# Patient Record
Sex: Female | Born: 1935 | Race: Black or African American | Hispanic: No | Smoking: Never smoker
Health system: Southern US, Community
[De-identification: ages and names within clinical notes are randomized; demographics above are authoritative.]

## PROBLEM LIST (undated history)

## (undated) DIAGNOSIS — I10 Essential (primary) hypertension: Secondary | ICD-10-CM

## (undated) DIAGNOSIS — F0390 Unspecified dementia without behavioral disturbance: Secondary | ICD-10-CM

## (undated) DIAGNOSIS — E7800 Pure hypercholesterolemia, unspecified: Secondary | ICD-10-CM

## (undated) DIAGNOSIS — A419 Sepsis, unspecified organism: Secondary | ICD-10-CM

## (undated) DIAGNOSIS — I5032 Chronic diastolic (congestive) heart failure: Secondary | ICD-10-CM

## (undated) DIAGNOSIS — N183 Chronic kidney disease, stage 3 unspecified: Secondary | ICD-10-CM

## (undated) DIAGNOSIS — K625 Hemorrhage of anus and rectum: Secondary | ICD-10-CM

## (undated) DIAGNOSIS — R55 Syncope and collapse: Secondary | ICD-10-CM

## (undated) DIAGNOSIS — K529 Noninfective gastroenteritis and colitis, unspecified: Secondary | ICD-10-CM

## (undated) DIAGNOSIS — E119 Type 2 diabetes mellitus without complications: Secondary | ICD-10-CM

## (undated) DIAGNOSIS — T7840XA Allergy, unspecified, initial encounter: Secondary | ICD-10-CM

## (undated) DIAGNOSIS — R569 Unspecified convulsions: Secondary | ICD-10-CM

## (undated) DIAGNOSIS — K219 Gastro-esophageal reflux disease without esophagitis: Secondary | ICD-10-CM

## (undated) DIAGNOSIS — D649 Anemia, unspecified: Secondary | ICD-10-CM

## (undated) DIAGNOSIS — I2699 Other pulmonary embolism without acute cor pulmonale: Secondary | ICD-10-CM

## (undated) DIAGNOSIS — F32A Depression, unspecified: Secondary | ICD-10-CM

## (undated) DIAGNOSIS — F329 Major depressive disorder, single episode, unspecified: Secondary | ICD-10-CM

## (undated) DIAGNOSIS — E785 Hyperlipidemia, unspecified: Secondary | ICD-10-CM

## (undated) DIAGNOSIS — M1990 Unspecified osteoarthritis, unspecified site: Secondary | ICD-10-CM

## (undated) HISTORY — DX: Unspecified convulsions: R56.9

## (undated) HISTORY — DX: Syncope and collapse: R55

## (undated) HISTORY — DX: Type 2 diabetes mellitus without complications: E11.9

## (undated) HISTORY — DX: Anemia, unspecified: D64.9

## (undated) HISTORY — DX: Allergy, unspecified, initial encounter: T78.40XA

## (undated) HISTORY — DX: Unspecified osteoarthritis, unspecified site: M19.90

## (undated) HISTORY — DX: Essential (primary) hypertension: I10

---

## 1998-12-31 ENCOUNTER — Ambulatory Visit (HOSPITAL_COMMUNITY): Admission: RE | Admit: 1998-12-31 | Discharge: 1998-12-31 | Payer: Self-pay | Admitting: Internal Medicine

## 2002-01-16 ENCOUNTER — Encounter: Payer: Self-pay | Admitting: Orthopedic Surgery

## 2002-01-16 ENCOUNTER — Emergency Department (HOSPITAL_COMMUNITY): Admission: EM | Admit: 2002-01-16 | Discharge: 2002-01-16 | Payer: Self-pay | Admitting: Emergency Medicine

## 2002-01-16 ENCOUNTER — Encounter: Payer: Self-pay | Admitting: Emergency Medicine

## 2003-12-16 ENCOUNTER — Inpatient Hospital Stay (HOSPITAL_COMMUNITY): Admission: EM | Admit: 2003-12-16 | Discharge: 2003-12-17 | Payer: Self-pay | Admitting: Emergency Medicine

## 2004-06-15 ENCOUNTER — Encounter: Admission: RE | Admit: 2004-06-15 | Discharge: 2004-06-15 | Payer: Self-pay | Admitting: Internal Medicine

## 2004-08-31 ENCOUNTER — Encounter: Admission: RE | Admit: 2004-08-31 | Discharge: 2004-08-31 | Payer: Self-pay | Admitting: Internal Medicine

## 2004-09-04 ENCOUNTER — Emergency Department (HOSPITAL_COMMUNITY): Admission: EM | Admit: 2004-09-04 | Discharge: 2004-09-04 | Payer: Self-pay | Admitting: Emergency Medicine

## 2006-05-11 ENCOUNTER — Emergency Department (HOSPITAL_COMMUNITY): Admission: EM | Admit: 2006-05-11 | Discharge: 2006-05-11 | Payer: Self-pay | Admitting: Emergency Medicine

## 2007-03-18 ENCOUNTER — Emergency Department (HOSPITAL_COMMUNITY): Admission: EM | Admit: 2007-03-18 | Discharge: 2007-03-18 | Payer: Self-pay | Admitting: Emergency Medicine

## 2007-10-21 ENCOUNTER — Encounter: Admission: RE | Admit: 2007-10-21 | Discharge: 2007-10-21 | Payer: Self-pay | Admitting: Internal Medicine

## 2008-04-24 ENCOUNTER — Inpatient Hospital Stay (HOSPITAL_COMMUNITY): Admission: EM | Admit: 2008-04-24 | Discharge: 2008-04-30 | Payer: Self-pay | Admitting: Emergency Medicine

## 2008-05-12 ENCOUNTER — Inpatient Hospital Stay (HOSPITAL_COMMUNITY): Admission: EM | Admit: 2008-05-12 | Discharge: 2008-05-14 | Payer: Self-pay | Admitting: Emergency Medicine

## 2008-09-13 ENCOUNTER — Emergency Department (HOSPITAL_COMMUNITY): Admission: EM | Admit: 2008-09-13 | Discharge: 2008-09-14 | Payer: Self-pay | Admitting: Emergency Medicine

## 2009-03-12 ENCOUNTER — Inpatient Hospital Stay (HOSPITAL_COMMUNITY): Admission: RE | Admit: 2009-03-12 | Discharge: 2009-03-15 | Payer: Self-pay | Admitting: Orthopaedic Surgery

## 2010-07-08 ENCOUNTER — Inpatient Hospital Stay (HOSPITAL_COMMUNITY): Admission: RE | Admit: 2010-07-08 | Discharge: 2010-07-12 | Payer: Self-pay | Admitting: Orthopaedic Surgery

## 2010-09-26 ENCOUNTER — Inpatient Hospital Stay (HOSPITAL_COMMUNITY): Admission: EM | Admit: 2010-09-26 | Discharge: 2010-09-28 | Payer: Self-pay | Admitting: Emergency Medicine

## 2010-09-27 ENCOUNTER — Encounter (INDEPENDENT_AMBULATORY_CARE_PROVIDER_SITE_OTHER): Payer: Self-pay | Admitting: Internal Medicine

## 2010-11-17 ENCOUNTER — Inpatient Hospital Stay (HOSPITAL_COMMUNITY)
Admission: EM | Admit: 2010-11-17 | Discharge: 2010-11-25 | Disposition: A | Discharge status: Other Acute Inpatient Hospital | Payer: Self-pay | Source: Home / Self Care

## 2010-11-18 ENCOUNTER — Encounter (INDEPENDENT_AMBULATORY_CARE_PROVIDER_SITE_OTHER): Payer: Self-pay

## 2010-11-20 HISTORY — PX: OTHER SURGICAL HISTORY: SHX169

## 2010-11-23 LAB — GLUCOSE, CAPILLARY
Glucose-Capillary: 123 mg/dL — ABNORMAL HIGH (ref 70–99)
Glucose-Capillary: 129 mg/dL — ABNORMAL HIGH (ref 70–99)
Glucose-Capillary: 129 mg/dL — ABNORMAL HIGH (ref 70–99)
Glucose-Capillary: 131 mg/dL — ABNORMAL HIGH (ref 70–99)
Glucose-Capillary: 133 mg/dL — ABNORMAL HIGH (ref 70–99)
Glucose-Capillary: 147 mg/dL — ABNORMAL HIGH (ref 70–99)

## 2010-11-24 LAB — CBC
HCT: 25.3 % — ABNORMAL LOW (ref 36.0–46.0)
Hemoglobin: 8.1 g/dL — ABNORMAL LOW (ref 12.0–15.0)
MCH: 26.9 pg (ref 26.0–34.0)
MCHC: 32 g/dL (ref 30.0–36.0)
MCV: 84.1 fL (ref 78.0–100.0)
Platelets: 413 10*3/uL — ABNORMAL HIGH (ref 150–400)
RBC: 3.01 MIL/uL — ABNORMAL LOW (ref 3.87–5.11)
RDW: 16.3 % — ABNORMAL HIGH (ref 11.5–15.5)
WBC: 7.5 10*3/uL (ref 4.0–10.5)

## 2010-11-24 LAB — COMPREHENSIVE METABOLIC PANEL
ALT: 21 U/L (ref 0–35)
AST: 25 U/L (ref 0–37)
Albumin: 2.2 g/dL — ABNORMAL LOW (ref 3.5–5.2)
Alkaline Phosphatase: 84 U/L (ref 39–117)
BUN: 6 mg/dL (ref 6–23)
CO2: 20 mEq/L (ref 19–32)
Calcium: 8.5 mg/dL (ref 8.4–10.5)
Chloride: 113 mEq/L — ABNORMAL HIGH (ref 96–112)
Creatinine, Ser: 1.25 mg/dL — ABNORMAL HIGH (ref 0.4–1.2)
GFR calc Af Amer: 51 mL/min — ABNORMAL LOW (ref >60)
GFR calc non Af Amer: 42 mL/min — ABNORMAL LOW (ref >60)
Glucose, Bld: 120 mg/dL — ABNORMAL HIGH (ref 70–99)
Potassium: 4.2 mEq/L (ref 3.5–5.1)
Sodium: 140 mEq/L (ref 135–145)
Total Bilirubin: 0.7 mg/dL (ref 0.3–1.2)
Total Protein: 6.4 g/dL (ref 6.0–8.3)

## 2010-11-24 LAB — GLUCOSE, CAPILLARY
Glucose-Capillary: 135 mg/dL — ABNORMAL HIGH (ref 70–99)
Glucose-Capillary: 108 mg/dL — ABNORMAL HIGH (ref 70–99)
Glucose-Capillary: 129 mg/dL — ABNORMAL HIGH (ref 70–99)
Glucose-Capillary: 140 mg/dL — ABNORMAL HIGH (ref 70–99)
Glucose-Capillary: 121 mg/dL — ABNORMAL HIGH (ref 70–99)
Glucose-Capillary: 89 mg/dL (ref 70–99)

## 2010-11-24 LAB — PROTIME-INR
INR: 1.7 — ABNORMAL HIGH (ref 0.00–1.49)
Prothrombin Time: 20.2 seconds — ABNORMAL HIGH (ref 11.6–15.2)

## 2010-11-24 LAB — PHOSPHORUS: Phosphorus: 2.6 mg/dL (ref 2.3–4.6)

## 2010-11-24 LAB — TSH: TSH: 2.718 u[IU]/mL (ref 0.350–4.500)

## 2010-11-24 LAB — MAGNESIUM: Magnesium: 2.3 mg/dL (ref 1.5–2.5)

## 2010-11-25 LAB — GLUCOSE, CAPILLARY
Glucose-Capillary: 100 mg/dL — ABNORMAL HIGH (ref 70–99)
Glucose-Capillary: 106 mg/dL — ABNORMAL HIGH (ref 70–99)
Glucose-Capillary: 108 mg/dL — ABNORMAL HIGH (ref 70–99)
Glucose-Capillary: 116 mg/dL — ABNORMAL HIGH (ref 70–99)

## 2010-12-05 LAB — GLUCOSE, CAPILLARY: Glucose-Capillary: 97 mg/dL (ref 70–99)

## 2010-12-11 ENCOUNTER — Encounter: Payer: Self-pay | Admitting: Internal Medicine

## 2011-01-14 ENCOUNTER — Emergency Department (HOSPITAL_COMMUNITY): Payer: Medicare Other

## 2011-01-14 ENCOUNTER — Inpatient Hospital Stay (HOSPITAL_COMMUNITY)
Admission: EM | Admit: 2011-01-14 | Discharge: 2011-01-20 | DRG: 683 | Disposition: A | Discharge status: Skilled Nursing Facility | Payer: Medicare Other | Attending: Internal Medicine | Admitting: Internal Medicine

## 2011-01-14 DIAGNOSIS — Z7901 Long term (current) use of anticoagulants: Secondary | ICD-10-CM

## 2011-01-14 DIAGNOSIS — K929 Disease of digestive system, unspecified: Secondary | ICD-10-CM | POA: Diagnosis present

## 2011-01-14 DIAGNOSIS — E7800 Pure hypercholesterolemia, unspecified: Secondary | ICD-10-CM | POA: Diagnosis present

## 2011-01-14 DIAGNOSIS — E119 Type 2 diabetes mellitus without complications: Secondary | ICD-10-CM | POA: Diagnosis present

## 2011-01-14 DIAGNOSIS — E860 Dehydration: Secondary | ICD-10-CM | POA: Diagnosis present

## 2011-01-14 DIAGNOSIS — E876 Hypokalemia: Secondary | ICD-10-CM | POA: Diagnosis present

## 2011-01-14 DIAGNOSIS — I129 Hypertensive chronic kidney disease with stage 1 through stage 4 chronic kidney disease, or unspecified chronic kidney disease: Secondary | ICD-10-CM | POA: Diagnosis present

## 2011-01-14 DIAGNOSIS — N3941 Urge incontinence: Secondary | ICD-10-CM | POA: Diagnosis present

## 2011-01-14 DIAGNOSIS — N179 Acute kidney failure, unspecified: Principal | ICD-10-CM | POA: Diagnosis present

## 2011-01-14 DIAGNOSIS — I824Y9 Acute embolism and thrombosis of unspecified deep veins of unspecified proximal lower extremity: Secondary | ICD-10-CM | POA: Diagnosis not present

## 2011-01-14 DIAGNOSIS — D509 Iron deficiency anemia, unspecified: Secondary | ICD-10-CM | POA: Diagnosis present

## 2011-01-14 DIAGNOSIS — Y832 Surgical operation with anastomosis, bypass or graft as the cause of abnormal reaction of the patient, or of later complication, without mention of misadventure at the time of the procedure: Secondary | ICD-10-CM | POA: Diagnosis present

## 2011-01-14 DIAGNOSIS — R569 Unspecified convulsions: Secondary | ICD-10-CM | POA: Diagnosis present

## 2011-01-14 DIAGNOSIS — Z86711 Personal history of pulmonary embolism: Secondary | ICD-10-CM

## 2011-01-14 DIAGNOSIS — N189 Chronic kidney disease, unspecified: Secondary | ICD-10-CM | POA: Diagnosis present

## 2011-01-14 DIAGNOSIS — Z79899 Other long term (current) drug therapy: Secondary | ICD-10-CM

## 2011-01-14 DIAGNOSIS — F0390 Unspecified dementia without behavioral disturbance: Secondary | ICD-10-CM | POA: Diagnosis present

## 2011-01-14 DIAGNOSIS — R791 Abnormal coagulation profile: Secondary | ICD-10-CM | POA: Diagnosis present

## 2011-01-14 LAB — PROTIME-INR
INR: 1.1 (ref 0.00–1.49)
Prothrombin Time: 14.4 seconds (ref 11.6–15.2)

## 2011-01-14 LAB — TROPONIN I: Troponin I: 0.04 ng/mL (ref 0.00–0.06)

## 2011-01-14 LAB — POCT CARDIAC MARKERS: Troponin i, poc: <0.05 ng/mL (ref 0.00–0.09)

## 2011-01-14 LAB — TSH: TSH: 0.674 u[IU]/mL (ref 0.350–4.500)

## 2011-01-14 LAB — DIFFERENTIAL
Eosinophils Absolute: 0.1 10*3/uL (ref 0.0–0.7)
Lymphs Abs: 1.5 10*3/uL (ref 0.7–4.0)
Monocytes Relative: 9 % (ref 3–12)
Neutro Abs: 6.6 10*3/uL (ref 1.7–7.7)
Neutrophils Relative %: 74 % (ref 43–77)

## 2011-01-14 LAB — CBC
HCT: 30.1 % — ABNORMAL LOW (ref 36.0–46.0)
Hemoglobin: 9.5 g/dL — ABNORMAL LOW (ref 12.0–15.0)
MCH: 26.2 pg (ref 26.0–34.0)
MCV: 82.9 fL (ref 78.0–100.0)
RBC: 3.63 MIL/uL — ABNORMAL LOW (ref 3.87–5.11)

## 2011-01-14 LAB — GLUCOSE, CAPILLARY: Glucose-Capillary: 125 mg/dL — ABNORMAL HIGH (ref 70–99)

## 2011-01-14 LAB — BASIC METABOLIC PANEL
Chloride: 107 mEq/L (ref 96–112)
Creatinine, Ser: 3.73 mg/dL — ABNORMAL HIGH (ref 0.4–1.2)
GFR calc Af Amer: 14 mL/min — ABNORMAL LOW (ref >60)
Potassium: 3.3 mEq/L — ABNORMAL LOW (ref 3.5–5.1)
Sodium: 138 mEq/L (ref 135–145)

## 2011-01-14 LAB — MAGNESIUM: Magnesium: 1.7 mg/dL (ref 1.5–2.5)

## 2011-01-14 LAB — CK TOTAL AND CKMB (NOT AT ARMC): CK, MB: 1.8 ng/mL (ref 0.3–4.0)

## 2011-01-15 LAB — BASIC METABOLIC PANEL
BUN: 45 mg/dL — ABNORMAL HIGH (ref 6–23)
CO2: 19 mEq/L (ref 19–32)
Chloride: 111 mEq/L (ref 96–112)
Glucose, Bld: 97 mg/dL (ref 70–99)
Potassium: 2.9 mEq/L — ABNORMAL LOW (ref 3.5–5.1)

## 2011-01-15 LAB — CK TOTAL AND CKMB (NOT AT ARMC)
CK, MB: 1.5 ng/mL (ref 0.3–4.0)
Relative Index: INVALID (ref 0.0–2.5)
Total CK: 63 U/L (ref 7–177)
Total CK: 62 U/L (ref 7–177)

## 2011-01-15 LAB — GLUCOSE, CAPILLARY
Glucose-Capillary: 94 mg/dL (ref 70–99)
Glucose-Capillary: 111 mg/dL — ABNORMAL HIGH (ref 70–99)

## 2011-01-15 LAB — LIPID PANEL
HDL: 38 mg/dL — ABNORMAL LOW (ref >39)
Total CHOL/HDL Ratio: 4.4 RATIO

## 2011-01-15 LAB — VITAMIN B12: Vitamin B-12: 405 pg/mL (ref 211–911)

## 2011-01-16 ENCOUNTER — Inpatient Hospital Stay (HOSPITAL_COMMUNITY): Payer: Medicare Other

## 2011-01-16 LAB — COMPREHENSIVE METABOLIC PANEL
ALT: 12 U/L (ref 0–35)
AST: 14 U/L (ref 0–37)
Alkaline Phosphatase: 64 U/L (ref 39–117)
Calcium: 8.6 mg/dL (ref 8.4–10.5)
GFR calc Af Amer: 20 mL/min — ABNORMAL LOW (ref >60)
Potassium: 3.4 mEq/L — ABNORMAL LOW (ref 3.5–5.1)
Sodium: 142 mEq/L (ref 135–145)
Total Protein: 7 g/dL (ref 6.0–8.3)

## 2011-01-16 LAB — GLUCOSE, CAPILLARY
Glucose-Capillary: 92 mg/dL (ref 70–99)
Glucose-Capillary: 135 mg/dL — ABNORMAL HIGH (ref 70–99)

## 2011-01-16 LAB — PROTIME-INR: Prothrombin Time: 14.4 seconds (ref 11.6–15.2)

## 2011-01-16 LAB — CBC
Hemoglobin: 7.7 g/dL — ABNORMAL LOW (ref 12.0–15.0)
MCV: 83.2 fL (ref 78.0–100.0)
Platelets: 175 10*3/uL (ref 150–400)
RBC: 2.91 MIL/uL — ABNORMAL LOW (ref 3.87–5.11)
WBC: 8.4 10*3/uL (ref 4.0–10.5)

## 2011-01-17 DIAGNOSIS — R55 Syncope and collapse: Secondary | ICD-10-CM

## 2011-01-17 LAB — COMPREHENSIVE METABOLIC PANEL
ALT: 11 U/L (ref 0–35)
Albumin: 2.3 g/dL — ABNORMAL LOW (ref 3.5–5.2)
Calcium: 8.2 mg/dL — ABNORMAL LOW (ref 8.4–10.5)
GFR calc Af Amer: 25 mL/min — ABNORMAL LOW (ref >60)
Glucose, Bld: 85 mg/dL (ref 70–99)
Sodium: 145 mEq/L (ref 135–145)
Total Protein: 6.7 g/dL (ref 6.0–8.3)

## 2011-01-17 LAB — PROTIME-INR
INR: 1.27 (ref 0.00–1.49)
Prothrombin Time: 16.1 seconds — ABNORMAL HIGH (ref 11.6–15.2)

## 2011-01-17 LAB — CBC
MCH: 26.7 pg (ref 26.0–34.0)
Platelets: 194 10*3/uL (ref 150–400)
RBC: 2.77 MIL/uL — ABNORMAL LOW (ref 3.87–5.11)
WBC: 8.6 10*3/uL (ref 4.0–10.5)

## 2011-01-17 LAB — VITAMIN B12: Vitamin B-12: 282 pg/mL (ref 211–911)

## 2011-01-17 LAB — FOLATE: Folate: 2.9 ng/mL — ABNORMAL LOW

## 2011-01-17 LAB — GLUCOSE, CAPILLARY
Glucose-Capillary: 106 mg/dL — ABNORMAL HIGH (ref 70–99)
Glucose-Capillary: 89 mg/dL (ref 70–99)
Glucose-Capillary: 105 mg/dL — ABNORMAL HIGH (ref 70–99)

## 2011-01-17 LAB — IRON AND TIBC: UIBC: 167 ug/dL

## 2011-01-18 DIAGNOSIS — M7989 Other specified soft tissue disorders: Secondary | ICD-10-CM

## 2011-01-18 LAB — IRON AND TIBC
Iron: 22 ug/dL — ABNORMAL LOW (ref 42–135)
Saturation Ratios: 12 % — ABNORMAL LOW (ref 20–55)
TIBC: 188 ug/dL — ABNORMAL LOW (ref 250–470)

## 2011-01-18 LAB — GLUCOSE, CAPILLARY
Glucose-Capillary: 89 mg/dL (ref 70–99)
Glucose-Capillary: 96 mg/dL (ref 70–99)

## 2011-01-18 LAB — CBC
MCV: 83.5 fL (ref 78.0–100.0)
Platelets: 226 10*3/uL (ref 150–400)
RBC: 2.91 MIL/uL — ABNORMAL LOW (ref 3.87–5.11)
WBC: 8.4 10*3/uL (ref 4.0–10.5)

## 2011-01-18 LAB — VITAMIN B12: Vitamin B-12: 266 pg/mL (ref 211–911)

## 2011-01-18 LAB — BASIC METABOLIC PANEL
Chloride: 118 mEq/L — ABNORMAL HIGH (ref 96–112)
GFR calc Af Amer: 29 mL/min — ABNORMAL LOW (ref >60)
Potassium: 3.4 mEq/L — ABNORMAL LOW (ref 3.5–5.1)

## 2011-01-18 LAB — PROTIME-INR
INR: 1.44 (ref 0.00–1.49)
Prothrombin Time: 17.7 seconds — ABNORMAL HIGH (ref 11.6–15.2)

## 2011-01-19 ENCOUNTER — Inpatient Hospital Stay (HOSPITAL_COMMUNITY): Payer: Medicare Other

## 2011-01-19 LAB — BASIC METABOLIC PANEL
CO2: 20 mEq/L (ref 19–32)
Calcium: 8.8 mg/dL (ref 8.4–10.5)
Chloride: 112 mEq/L (ref 96–112)
GFR calc Af Amer: 30 mL/min — ABNORMAL LOW (ref >60)
Glucose, Bld: 95 mg/dL (ref 70–99)
Sodium: 143 mEq/L (ref 135–145)

## 2011-01-19 LAB — CROSSMATCH
ABO/RH(D): A POS
Antibody Screen: NEGATIVE

## 2011-01-19 LAB — GLUCOSE, CAPILLARY
Glucose-Capillary: 110 mg/dL — ABNORMAL HIGH (ref 70–99)
Glucose-Capillary: 86 mg/dL (ref 70–99)

## 2011-01-19 LAB — DIFFERENTIAL
Basophils Absolute: 0 10*3/uL (ref 0.0–0.1)
Lymphocytes Relative: 16 % (ref 12–46)
Monocytes Absolute: 0.9 10*3/uL (ref 0.1–1.0)
Monocytes Relative: 10 % (ref 3–12)
Neutro Abs: 6.3 10*3/uL (ref 1.7–7.7)
Neutrophils Relative %: 70 % (ref 43–77)

## 2011-01-19 LAB — CBC
HCT: 29.7 % — ABNORMAL LOW (ref 36.0–46.0)
Hemoglobin: 9.6 g/dL — ABNORMAL LOW (ref 12.0–15.0)
MCH: 27 pg (ref 26.0–34.0)
MCHC: 32.3 g/dL (ref 30.0–36.0)
RBC: 3.56 MIL/uL — ABNORMAL LOW (ref 3.87–5.11)

## 2011-01-20 LAB — BASIC METABOLIC PANEL
BUN: 18 mg/dL (ref 6–23)
CO2: 19 mEq/L (ref 19–32)
Chloride: 114 mEq/L — ABNORMAL HIGH (ref 96–112)
GFR calc non Af Amer: 26 mL/min — ABNORMAL LOW (ref >60)
Glucose, Bld: 79 mg/dL (ref 70–99)
Potassium: 3.8 mEq/L (ref 3.5–5.1)
Sodium: 140 mEq/L (ref 135–145)

## 2011-01-20 LAB — CBC
HCT: 29 % — ABNORMAL LOW (ref 36.0–46.0)
Hemoglobin: 9.2 g/dL — ABNORMAL LOW (ref 12.0–15.0)
MCH: 26.2 pg (ref 26.0–34.0)
MCHC: 31.7 g/dL (ref 30.0–36.0)
MCV: 82.6 fL (ref 78.0–100.0)
RBC: 3.51 MIL/uL — ABNORMAL LOW (ref 3.87–5.11)

## 2011-01-20 LAB — PROTIME-INR: Prothrombin Time: 25.9 seconds — ABNORMAL HIGH (ref 11.6–15.2)

## 2011-01-27 NOTE — Discharge Summary (Signed)
Bianca Wyatt, MUCHA NO.:  [id number]  MEDICAL RECORD NO.:  [id number]           PATIENT TYPE:  I  LOCATION:  5524                         FACILITY:  [facility]  PHYSICIAN:  Isidor Holts, M.D.  DATE OF BIRTH:  12-Sep-1934  DATE OF ADMISSION:  01/14/2011 DATE OF DISCHARGE:  01/20/2011                        DISCHARGE SUMMARY - REFERRING   PRIMARY MD:  [facility].  DISCHARGE DIAGNOSES: 1. Syncopal episode, likely secondary to dehydration and orthostasis. 2. Possible seizure episode. 3. Dehydration/acute renal failure. 4. Venous thromboembolic disease, i.e. history of pulmonary embolism     2001, also new left lower extremity deep venous thrombosis,     diagnosed this hospitalization. 5. Chronic anticoagulation. 6. Type 2 diabetes mellitus, insulin requiring. 7. Hypertension. 8. Chronic kidney disease, baseline creatinine 1.3-1.4. 9. Chronic normocytic anemia secondary to chronic disease and irondeficiency.  DISCHARGE MEDICATIONS: 1. Tylenol 650 mg p.o. p.r.n. q. 4 hourly for pain. 2. Lovenox 90 mg subcutaneously daily to be discontinued on January 22, 2011. 3. Lantus insulin 8 units subcutaneously nightly. 4. Nu-Iron 150 mg p.o. b.i.d. 5. Lactulose 15 mL p.o. b.i.d. 6. Coumadin per INR currently on 7.5 mg p.o. at 6 p.m. daily. 7. Amlodipine 10 mg p.o. q.p.m. 8. Exelon 4.6 mg one patch transdermally q.a.m. 9. Simvastatin 20 mg p.o. nightly. 10.VESIcare 10 mg p.o. daily.  Note: Diovan/HCTZ has been discontinued secondary to acute renal failure and dehydration.  Also hydralazine has been discontinued.  PROCEDURES: 1. Chest x-ray January 14, 2011, this showed no acute cardiopulmonary     abnormalities. 2. Head CT scan January 14, 2011, this showed atrophy and advanced     chronic small vessel disease.  No acute or reversible findings. 3. Renal ultrasound scan January 14, 2011, this showed no     hydronephrosis.  There was medical renal  disease similar to     September 26, 2010. 4. EEG January 16, 2011, this was minimally abnormal EEG recording     due to mild diffuse background slowing seen.  This is nonspecific     and comes in with any process with results in a toxic metabolic     encephalopathy or any dementing type illness.  No epileptiform     discharges are seen. 5. A 2-D echocardiogram January 17, 2011, this showed normal left     ventricular cavity size, moderate concentric hypertrophy, normal     systolic function, EF 60% to 65%, wall motion was normal.  Doppler     parameters consistent with grade 1 diastolic dysfunction.  There is     suggestion of elevated LV filling pressure, mitral valve analysis     calcified, left atrium mildly dilated, pulmonary artery peak     pressure 38 mmHg. 6. Lower extremity venous Doppler January 18, 2011, this showed     findings consistent with acute deep vein thrombosis involving the     left lower extremity.  CONSULTATIONS:  None.  ADMISSION HISTORY:  As in H and P notes of 01/14/2011, dictated by Dr. [name].  However, in brief, this is a 77 year old female, with known history of type  2 diabetes mellitus, hypertension, dyslipidemia, history of pulmonary embolism 2009 on chronic anticoagulation, history of chronic kidney disease baseline creatinine 1.3,-1.4, status post exploratory laparotomy and segmental small bowel resection with primary anastomosis in January 2012 for hemorrhagic ischemic bowel, presenting with a syncopal episode said to have lasted approximately 10 minutes, during which eyes rolled backwards and she had a shaky episode followed by a period of confusion.  The episode was witnessed by home health aide.  EMS was called.  The patient was then brought to the emergency department, where she was found to have no obvious focal neurologic deficit.  BUN and creatinine was found to be 43 and 3.7 respectively. The patient was not admitted for further  evaluation, investigation and management.  CLINICAL COURSE: 1. Syncopal episode, precise etiology is unclear.  However, the     patient presented with BP of 109/70 and was also found to be     dehydrated.  Likely a syncopal episode was on the basis of volume     depletion, dehydration and orthostasis.  She was managed with     intravenous fluid hydration with normal saline.  ARB and diuretic     treatments were discontinued as well as hydralazine.  Exelon patch     was also temporarily held.  CVA/TIA workup was carried out.  For     details of findings, please refer to procedure list above.  Workup     was unrevealing with improvement of hydration status and     hypotension.  No further episodes were documented during the course     of this hospitalization.  2. Possible seizure disorder.  This may have followed #1 above.  Brain     MRI showed no evidence of acute pathology.  EEG was unremarkable     for epileptiform activity.  The patient had no recurrences during     the course of this hospitalization.  She was therefore not     commenced on anticonvulsant medication.  3. Dehydration/acute renal failure.  As described above, the patient     presented with BUN of 43, creatinine 3.73 against a baseline known     creatinine of 1.3-1.4.  This was consistent with acute renal     failure on chronic kidney disease.  She responded to intravenous     fluid hydration, with gradual improvement in renal indices.  We are     pleased to note that as of January 20, 2011 BUN was 18 and creatinine     1.91.  Further improvement is anticipated.  4. Venous thromboembolic disease.  The patient has a known history of     pulmonary embolism 2009 and has been on chronic anticoagulation.     She however, presented with a markedly subtherapeutic INR of 1.1.     She was reinstated on Coumadin therapy during this hospitalization,     however, on December 29, 2010 clinically she was found to have a     swollen  left calf with increased circumference concerning for DVT.     This was confirmed by a subsequent left lower extremity venous     Doppler.  The patient was therefore placed on bridging Lovenox and     on January 20, 2011 INR was 2.3, i.e. therapeutic.  We have had a     discussion with the clinical pharmacologist.  The patient's maintenance     Coumadin therapy should be 7.5 mg for now, adjusted according to  INR and Lovenox will be discontinued after appropriate overlap, on January 22, 2011.  5. Type 2 diabetes mellitus.  This was managed with diet, sliding-     scale insulin coverage and scheduled Lantus insulin during the     course of this hospitalization, and the patient remained euglycemic.  6. Hypertension.  The patient presented with a borderline low blood     pressure.  Over the course of the hospitalization, BP has     normalized.  She was therefore managed with calcium channel     antagonist and has remained normotensive.  7. Chronic normocytic anemia.  The patient's hemoglobin drifted down     to 7.7 on January 18, 2011.  She was therefore transfused with 4     units PRBC, with satisfactory bump in hemoglobin to 9.6 on January 19, 2011.  Hemoglobin has remained stable ever since and was 9.2 on     January 20, 2011.  Iron studies and hematinic showed iron level of 22,     TIBC 188, percentage saturation 12.  B12 was 266, ferritin was 161.     The patient has been commenced on iron supplements accordingly.  DISPOSITION:  The patient on January 20, 2011 was asymptomatic.  There were no new issues.  She was considered clinically stable for discharge, and therefore discharged accordingly.  She was evaluated by PT/OT during the course of this hospitalization.  SNF has been recommended.  Activity as tolerated, otherwise per PT/OT.  DIET:  Heart-healthy/carbohydrate modified.  FOLLOWUP INSTRUCTIONS:  The patient is to follow up with her primary MD, [facility].  SPECIAL  INSTRUCTIONS:  Regular PT/INR check as well as adjustment of Coumadin dosage as indicated, will be carried out by Senior care MD.     Isidor Holts, M.D.     CO/MEDQ  D:  01/20/2011  T:  01/20/2011  Job:  [id number]  Electronically Signed by Isidor Holts M.D. on 01/27/2011 03:33:38 PM

## 2011-01-30 LAB — MAGNESIUM
Magnesium: 1.8 mg/dL (ref 1.5–2.5)
Magnesium: 1.8 mg/dL (ref 1.5–2.5)

## 2011-01-30 LAB — COMPREHENSIVE METABOLIC PANEL
ALT: 12 U/L (ref 0–35)
Albumin: 2.7 g/dL — ABNORMAL LOW (ref 3.5–5.2)
Alkaline Phosphatase: 50 U/L (ref 39–117)
Chloride: 110 mEq/L (ref 96–112)
Potassium: 3.5 mEq/L (ref 3.5–5.1)
Sodium: 139 mEq/L (ref 135–145)
Total Bilirubin: 0.7 mg/dL (ref 0.3–1.2)
Total Protein: 6.9 g/dL (ref 6.0–8.3)

## 2011-01-30 LAB — URINALYSIS, ROUTINE W REFLEX MICROSCOPIC
Glucose, UA: NEGATIVE mg/dL
Hgb urine dipstick: NEGATIVE
Protein, ur: NEGATIVE mg/dL
Specific Gravity, Urine: 1.02 (ref 1.005–1.030)

## 2011-01-30 LAB — GLUCOSE, CAPILLARY
Glucose-Capillary: 109 mg/dL — ABNORMAL HIGH (ref 70–99)
Glucose-Capillary: 110 mg/dL — ABNORMAL HIGH (ref 70–99)
Glucose-Capillary: 117 mg/dL — ABNORMAL HIGH (ref 70–99)
Glucose-Capillary: 121 mg/dL — ABNORMAL HIGH (ref 70–99)
Glucose-Capillary: 123 mg/dL — ABNORMAL HIGH (ref 70–99)
Glucose-Capillary: 126 mg/dL — ABNORMAL HIGH (ref 70–99)
Glucose-Capillary: 131 mg/dL — ABNORMAL HIGH (ref 70–99)
Glucose-Capillary: 134 mg/dL — ABNORMAL HIGH (ref 70–99)
Glucose-Capillary: 140 mg/dL — ABNORMAL HIGH (ref 70–99)
Glucose-Capillary: 145 mg/dL — ABNORMAL HIGH (ref 70–99)
Glucose-Capillary: 153 mg/dL — ABNORMAL HIGH (ref 70–99)
Glucose-Capillary: 166 mg/dL — ABNORMAL HIGH (ref 70–99)
Glucose-Capillary: 96 mg/dL (ref 70–99)

## 2011-01-30 LAB — PREPARE FRESH FROZEN PLASMA: Unit division: 0

## 2011-01-30 LAB — CBC
HCT: 25.5 % — ABNORMAL LOW (ref 36.0–46.0)
HCT: 27.2 % — ABNORMAL LOW (ref 36.0–46.0)
Hemoglobin: 8.2 g/dL — ABNORMAL LOW (ref 12.0–15.0)
MCH: 26.8 pg (ref 26.0–34.0)
MCH: 27.1 pg (ref 26.0–34.0)
MCHC: 32.3 g/dL (ref 30.0–36.0)
MCV: 84.2 fL (ref 78.0–100.0)
MCV: 84.6 fL (ref 78.0–100.0)
Platelets: 236 10*3/uL (ref 150–400)
Platelets: 284 10*3/uL (ref 150–400)
Platelets: 325 10*3/uL (ref 150–400)
Platelets: 335 10*3/uL (ref 150–400)
RBC: 2.72 MIL/uL — ABNORMAL LOW (ref 3.87–5.11)
RBC: 2.99 MIL/uL — ABNORMAL LOW (ref 3.87–5.11)
RBC: 3.03 MIL/uL — ABNORMAL LOW (ref 3.87–5.11)
RBC: 3.33 MIL/uL — ABNORMAL LOW (ref 3.87–5.11)
RBC: 3.47 MIL/uL — ABNORMAL LOW (ref 3.87–5.11)
RDW: 15.7 % — ABNORMAL HIGH (ref 11.5–15.5)
WBC: 11.2 10*3/uL — ABNORMAL HIGH (ref 4.0–10.5)
WBC: 14.3 10*3/uL — ABNORMAL HIGH (ref 4.0–10.5)
WBC: 15.7 10*3/uL — ABNORMAL HIGH (ref 4.0–10.5)

## 2011-01-30 LAB — BASIC METABOLIC PANEL
CO2: 24 mEq/L (ref 19–32)
Calcium: 7.8 mg/dL — ABNORMAL LOW (ref 8.4–10.5)
Calcium: 8.5 mg/dL (ref 8.4–10.5)
Chloride: 114 mEq/L — ABNORMAL HIGH (ref 96–112)
Chloride: 114 mEq/L — ABNORMAL HIGH (ref 96–112)
Chloride: 115 mEq/L — ABNORMAL HIGH (ref 96–112)
Creatinine, Ser: 1.41 mg/dL — ABNORMAL HIGH (ref 0.4–1.2)
Creatinine, Ser: 1.5 mg/dL — ABNORMAL HIGH (ref 0.4–1.2)
Creatinine, Ser: 1.76 mg/dL — ABNORMAL HIGH (ref 0.4–1.2)
GFR calc Af Amer: 34 mL/min — ABNORMAL LOW (ref >60)
GFR calc Af Amer: 43 mL/min — ABNORMAL LOW (ref >60)
GFR calc Af Amer: 44 mL/min — ABNORMAL LOW (ref >60)
GFR calc non Af Amer: 34 mL/min — ABNORMAL LOW (ref >60)
Glucose, Bld: 119 mg/dL — ABNORMAL HIGH (ref 70–99)
Potassium: 3.2 mEq/L — ABNORMAL LOW (ref 3.5–5.1)
Potassium: 4.2 mEq/L (ref 3.5–5.1)
Sodium: 144 mEq/L (ref 135–145)

## 2011-01-30 LAB — DIFFERENTIAL
Basophils Relative: 0 % (ref 0–1)
Eosinophils Absolute: 0 10*3/uL (ref 0.0–0.7)
Eosinophils Relative: 0 % (ref 0–5)
Monocytes Absolute: 1.3 10*3/uL — ABNORMAL HIGH (ref 0.1–1.0)
Monocytes Relative: 8 % (ref 3–12)

## 2011-01-30 LAB — HEMOGLOBIN A1C: Hgb A1c MFr Bld: 6.5 % — ABNORMAL HIGH (ref <5.7)

## 2011-01-30 LAB — SAMPLE TO BLOOD BANK

## 2011-01-30 LAB — CROSSMATCH
ABO/RH(D): A POS
Unit division: 0

## 2011-01-30 LAB — PROTIME-INR
INR: >10 (ref 0.00–1.49)
Prothrombin Time: 15.9 seconds — ABNORMAL HIGH (ref 11.6–15.2)
Prothrombin Time: >90 seconds — ABNORMAL HIGH (ref 11.6–15.2)

## 2011-01-30 LAB — APTT: aPTT: >200 seconds (ref 24–37)

## 2011-01-30 LAB — URINE CULTURE: Colony Count: NO GROWTH

## 2011-01-30 LAB — PHOSPHORUS: Phosphorus: 2.5 mg/dL (ref 2.3–4.6)

## 2011-01-30 LAB — MRSA PCR SCREENING: MRSA by PCR: NEGATIVE

## 2011-01-31 LAB — CHLORIDE, URINE, RANDOM: Chloride Urine: 76 mEq/L

## 2011-01-31 LAB — URINALYSIS, MICROSCOPIC ONLY
Ketones, ur: NEGATIVE mg/dL
Nitrite: NEGATIVE
Protein, ur: NEGATIVE mg/dL
Urobilinogen, UA: 0.2 mg/dL (ref 0.0–1.0)

## 2011-01-31 LAB — COMPREHENSIVE METABOLIC PANEL
ALT: 16 U/L (ref 0–35)
CO2: 21 mEq/L (ref 19–32)
Calcium: 9 mg/dL (ref 8.4–10.5)
Creatinine, Ser: 2.63 mg/dL — ABNORMAL HIGH (ref 0.4–1.2)
GFR calc non Af Amer: 18 mL/min — ABNORMAL LOW (ref >60)
Glucose, Bld: 159 mg/dL — ABNORMAL HIGH (ref 70–99)
Total Bilirubin: 0.4 mg/dL (ref 0.3–1.2)

## 2011-01-31 LAB — PROTEIN ELECTROPHORESIS, SERUM
Alpha-1-Globulin: 5.1 % — ABNORMAL HIGH (ref 2.9–4.9)
Alpha-2-Globulin: 14.4 % — ABNORMAL HIGH (ref 7.1–11.8)
Beta Globulin: 5 % (ref 4.7–7.2)
M-Spike, %: NOT DETECTED g/dL
Total Protein ELP: 7.6 g/dL (ref 6.0–8.3)

## 2011-01-31 LAB — NA AND K (SODIUM & POTASSIUM), RAND UR: Potassium Urine: 31 mEq/L

## 2011-01-31 LAB — CBC
HCT: 33.5 % — ABNORMAL LOW (ref 36.0–46.0)
Hemoglobin: 10.8 g/dL — ABNORMAL LOW (ref 12.0–15.0)
Hemoglobin: 9.8 g/dL — ABNORMAL LOW (ref 12.0–15.0)
MCH: 26.4 pg (ref 26.0–34.0)
MCH: 26.4 pg (ref 26.0–34.0)
MCHC: 32.2 g/dL (ref 30.0–36.0)
Platelets: 362 10*3/uL (ref 150–400)
RBC: 3.71 MIL/uL — ABNORMAL LOW (ref 3.87–5.11)
WBC: 8.1 10*3/uL (ref 4.0–10.5)

## 2011-01-31 LAB — PHOSPHORUS: Phosphorus: 3.4 mg/dL (ref 2.3–4.6)

## 2011-01-31 LAB — CK TOTAL AND CKMB (NOT AT ARMC)
CK, MB: 1 ng/mL (ref 0.3–4.0)
Relative Index: 1.2 (ref 0.0–2.5)
Total CK: 102 U/L (ref 7–177)
Total CK: 118 U/L (ref 7–177)
Total CK: 87 U/L (ref 7–177)

## 2011-01-31 LAB — MAGNESIUM: Magnesium: 2 mg/dL (ref 1.5–2.5)

## 2011-01-31 LAB — UIFE/LIGHT CHAINS/TP QN, 24-HR UR
Albumin, U: DETECTED
Alpha 1, Urine: DETECTED — AB
Beta, Urine: DETECTED — AB
Free Lambda Lt Chains,Ur: 0.39 mg/dL (ref 0.08–1.01)
Gamma Globulin, Urine: DETECTED — AB
Total Protein, Urine: 7.5 mg/dL

## 2011-01-31 LAB — PROTIME-INR
INR: 2.79 — ABNORMAL HIGH (ref 0.00–1.49)
INR: 2.84 — ABNORMAL HIGH (ref 0.00–1.49)
Prothrombin Time: 29.9 seconds — ABNORMAL HIGH (ref 11.6–15.2)

## 2011-01-31 LAB — URINE CULTURE
Colony Count: NO GROWTH
Culture  Setup Time: 201111080846

## 2011-01-31 LAB — LIPID PANEL
Cholesterol: 139 mg/dL (ref 0–200)
HDL: 37 mg/dL — ABNORMAL LOW (ref >39)
Total CHOL/HDL Ratio: 3.8 RATIO
VLDL: 21 mg/dL (ref 0–40)

## 2011-01-31 LAB — BASIC METABOLIC PANEL
CO2: 21 mEq/L (ref 19–32)
Calcium: 8.6 mg/dL (ref 8.4–10.5)
Chloride: 111 mEq/L (ref 96–112)
Creatinine, Ser: 2.1 mg/dL — ABNORMAL HIGH (ref 0.4–1.2)
GFR calc Af Amer: 28 mL/min — ABNORMAL LOW (ref >60)
Sodium: 139 mEq/L (ref 135–145)

## 2011-01-31 LAB — DIFFERENTIAL
Basophils Absolute: 0 10*3/uL (ref 0.0–0.1)
Eosinophils Relative: 0 % (ref 0–5)
Monocytes Absolute: 0.8 10*3/uL (ref 0.1–1.0)
Monocytes Relative: 7 % (ref 3–12)
Neutrophils Relative %: 78 % — ABNORMAL HIGH (ref 43–77)

## 2011-01-31 LAB — GLUCOSE, CAPILLARY
Glucose-Capillary: 110 mg/dL — ABNORMAL HIGH (ref 70–99)
Glucose-Capillary: 162 mg/dL — ABNORMAL HIGH (ref 70–99)

## 2011-01-31 LAB — POCT CARDIAC MARKERS: Troponin i, poc: <0.05 ng/mL (ref 0.00–0.09)

## 2011-01-31 LAB — TROPONIN I: Troponin I: 0.02 ng/mL (ref 0.00–0.06)

## 2011-02-03 LAB — BASIC METABOLIC PANEL
Calcium: 8.6 mg/dL (ref 8.4–10.5)
GFR calc Af Amer: 34 mL/min — ABNORMAL LOW (ref >60)
GFR calc non Af Amer: 28 mL/min — ABNORMAL LOW (ref >60)
GFR calc non Af Amer: 30 mL/min — ABNORMAL LOW (ref >60)
GFR calc non Af Amer: 32 mL/min — ABNORMAL LOW (ref >60)
Glucose, Bld: 123 mg/dL — ABNORMAL HIGH (ref 70–99)
Potassium: 3.3 mEq/L — ABNORMAL LOW (ref 3.5–5.1)
Potassium: 3.7 mEq/L (ref 3.5–5.1)
Sodium: 138 mEq/L (ref 135–145)
Sodium: 139 mEq/L (ref 135–145)
Sodium: 140 mEq/L (ref 135–145)

## 2011-02-03 LAB — CBC
HCT: 25.9 % — ABNORMAL LOW (ref 36.0–46.0)
HCT: 30.2 % — ABNORMAL LOW (ref 36.0–46.0)
Hemoglobin: 8.8 g/dL — ABNORMAL LOW (ref 12.0–15.0)
Hemoglobin: 9.7 g/dL — ABNORMAL LOW (ref 12.0–15.0)
MCH: 28.8 pg (ref 26.0–34.0)
Platelets: 263 10*3/uL (ref 150–400)
Platelets: 321 10*3/uL (ref 150–400)
RBC: 3.36 MIL/uL — ABNORMAL LOW (ref 3.87–5.11)
RBC: 3.52 MIL/uL — ABNORMAL LOW (ref 3.87–5.11)
RBC: 3.89 MIL/uL (ref 3.87–5.11)
RDW: 14.3 % (ref 11.5–15.5)
RDW: 14.4 % (ref 11.5–15.5)
RDW: 14.7 % (ref 11.5–15.5)
WBC: 10.2 10*3/uL (ref 4.0–10.5)
WBC: 8.7 10*3/uL (ref 4.0–10.5)

## 2011-02-03 LAB — COMPREHENSIVE METABOLIC PANEL
AST: 25 U/L (ref 0–37)
Albumin: 3.7 g/dL (ref 3.5–5.2)
Calcium: 9.2 mg/dL (ref 8.4–10.5)
Chloride: 110 mEq/L (ref 96–112)
Creatinine, Ser: 2.46 mg/dL — ABNORMAL HIGH (ref 0.4–1.2)
GFR calc Af Amer: 23 mL/min — ABNORMAL LOW (ref >60)
Total Bilirubin: 0.4 mg/dL (ref 0.3–1.2)
Total Protein: 8.3 g/dL (ref 6.0–8.3)

## 2011-02-03 LAB — PROTIME-INR
INR: 1.53 — ABNORMAL HIGH (ref 0.00–1.49)
INR: 1.59 — ABNORMAL HIGH (ref 0.00–1.49)
Prothrombin Time: 14.9 seconds (ref 11.6–15.2)
Prothrombin Time: 18.6 seconds — ABNORMAL HIGH (ref 11.6–15.2)
Prothrombin Time: 21.3 seconds — ABNORMAL HIGH (ref 11.6–15.2)

## 2011-02-03 LAB — GLUCOSE, CAPILLARY

## 2011-02-03 LAB — SURGICAL PCR SCREEN: MRSA, PCR: NEGATIVE

## 2011-02-07 NOTE — H&P (Signed)
Bianca Wyatt, [name] NO.:  [id number]  MEDICAL RECORD NO.:  [id number]           PATIENT TYPE:  I  LOCATION:  2039                         FACILITY:  [facility]  PHYSICIAN:  [name], MD   DATE OF BIRTH:  01-17-1934  DATE OF ADMISSION:  01/14/2011 DATE OF DISCHARGE:                             HISTORY & PHYSICAL   PRIMARY CARE PHYSICIAN:  Medical laboratory scientific officer.  CHIEF COMPLAINT:  Weakness, syncope versus seizure.  HISTORY OF PRESENT ILLNESS:  This is a 77 year old African American woman, who was at home earlier with nurse aide and was witnessed to have the syncopal event lasting for about 10 minutes.  According to the information obtained from the emergency room physician, the patient's eyes rolled backward and she had some shaking episode.  This was followed by period of confusion.  The episode lasted for about 10 minutes after which the patient came back to herself.  The home aide nurse witnessed the occurrence and subsequently called the ambulance. Upon arrival, the patient was evaluated in the emergency room and found to have no focal neurological deficit.  The patient is completely unaware of the event.  Apparently, she has had a similar episode in November of last year.  Also of note, the patient said she has not been drinking or eating well for the past 2 weeks.  BUN and creatinine was 43 and 3.73 respectively compared to 6 and 1.25 a month and half ago.  She was last admitted in January 2012 for hemorrhagic ischemic bowel, which was treated with exploratory laparotomy and segmental small bowel resection anastomosis.  She has some good day since from the procedure and the wound is not completely healed at this time.  The patient denies any fever, cough, nausea, vomiting, diaphoresis, chest pain, shortness of breath, weakness in any of the extremities, blurred vision, urinary symptoms, back pain, abdominal pain, diarrhea, or constipation.  When  I asked her, she denies any  prior seizure activity.  There has been no repeat episodes since admitted to the emergency room.  PAST MEDICAL HISTORY: 1. Diabetes mellitus type 2. 2. High blood pressure. 3. High cholesterol. 4. History of pulmonary embolism in 2009 on Coumadin. 5. History of chronic kidney disease with baseline creatinine of about     1.3 to 1.4. 6. Last hemoglobin A1c in January of this year was 6.5.  CURRENT MEDICATIONS: 1. Coumadin, dose unavailable. 2. Amlodipine 10 mg daily. 3. Diovan HCT 320/25 one tab daily. 4. Exelon transdermally. 5. Hydralazine 10 mg t.i.d. 6. Zocor 20 mg at bedtime. 7. VESIcare 10 mg daily.  ALLERGIES:  None.  SOCIAL HISTORY:  No smoking, alcohol, or drug use.  The patient lives alone.  REVIEW OF SYSTEMS:  Ten-point review of systems is negative except as above.  PHYSICAL EXAMINATION:  VITAL SIGNS:  Blood pressure is 109/70, pulse 68, respirations 16, temperature 97.7, and O2 sat is 100%. GENERAL:  The patient is comfortable, in no distress.  She appeared dehydrated with dry oral mucosa. NECK:  Supple.  No JVD, adenopathy, or thyromegaly. LUNGS:  Clear to auscultation bilaterally.  No wheezing or crackles. HEART:  S1 and S2.  Regular rate and rhythm.  No murmurs, rubs, or gallops. ABDOMEN:  Full, soft, and nontender.  She has a midline wound dehiscence with no obvious evidence of infection, probably about the stage II.  No tenderness. Bowel sounds present.  No masses. EXTREMITIES:  No edema, clubbing, or cyanosis. NEUROLOGIC:  No focal deficits.  LABORATORY DATA:  White count 9.0, hemoglobin 9.5, and platelet count is 168.  Chemistries: Sodium is 138, potassium 3.3, BUN 53, creatinine 3.73, calcium 9, and INR 1.1.  Chest x-ray showed no acute cardiopulmonary process.  CT of the head showed atrophy and advanced chronic small vessel disease with no acute findings.  EKG showed normal sinus rhythm with nonspecific ST  changes.  ASSESSMENT: 1. This 77 year old woman admitted with syncope as well as seizures.     I think both diagnosis should be actively pursued at this time even     though I favor modulator. 2. Acute on chronic kidney failure. 3. Hypokalemia. 4. History of pulmonary embolism on Coumadin with subtherapeutic INR. 5. Diabetes mellitus with fairly controlled blood glucose.  PLAN:  Admit to telemetry.  The patient will get syncope workup including 2-D echo, carotid Doppler.  She will also get TSH, B12, and folic acid level.  She will get orthostatic vital signs.  I have also ordered an EEG.  If all these tests return normal, she might benefit also from a brain MRI.  We ordered a phospholipid profile for tomorrow. Continue the home medication.  For kidney failure, she will have IV fluids, kidney ultrasound, while I will hold on nephrotoxic agents.  The patient should be on seizure precaution.  Ativan will be used if any seizure activity is witnessed.  She might benefit from the Nephrology consultation on her kidney failure.  Does not respond to IV fluids.  For her hypokalemia, I will give her 1 dose of potassium and follow the trend.  Recommendation be continued.  Condition is fairly stable.     [name], MD     FA/MEDQ  D:  01/14/2011  T:  01/14/2011  Job:  [id number]  Electronically Signed by [name]  on 02/07/2011 02:09:17 AM

## 2011-03-01 LAB — CBC
HCT: 25.8 % — ABNORMAL LOW (ref 36.0–46.0)
HCT: 30.5 % — ABNORMAL LOW (ref 36.0–46.0)
Hemoglobin: 10 g/dL — ABNORMAL LOW (ref 12.0–15.0)
Hemoglobin: 8.5 g/dL — ABNORMAL LOW (ref 12.0–15.0)
MCHC: 33.7 g/dL (ref 30.0–36.0)
Platelets: 243 10*3/uL (ref 150–400)
RBC: 2.94 MIL/uL — ABNORMAL LOW (ref 3.87–5.11)
RDW: 14.4 % (ref 11.5–15.5)
WBC: 9.8 10*3/uL (ref 4.0–10.5)
WBC: 9.9 10*3/uL (ref 4.0–10.5)

## 2011-03-01 LAB — BASIC METABOLIC PANEL
BUN: 15 mg/dL (ref 6–23)
CO2: 24 mEq/L (ref 19–32)
Calcium: 8.6 mg/dL (ref 8.4–10.5)
Calcium: 8.7 mg/dL (ref 8.4–10.5)
Calcium: 9.2 mg/dL (ref 8.4–10.5)
Chloride: 111 mEq/L (ref 96–112)
Creatinine, Ser: 1.28 mg/dL — ABNORMAL HIGH (ref 0.4–1.2)
GFR calc Af Amer: 50 mL/min — ABNORMAL LOW (ref >60)
GFR calc Af Amer: 58 mL/min — ABNORMAL LOW (ref >60)
GFR calc non Af Amer: 47 mL/min — ABNORMAL LOW (ref >60)
GFR calc non Af Amer: 51 mL/min — ABNORMAL LOW (ref >60)
Glucose, Bld: 139 mg/dL — ABNORMAL HIGH (ref 70–99)
Potassium: 3.9 mEq/L (ref 3.5–5.1)
Sodium: 136 mEq/L (ref 135–145)
Sodium: 141 mEq/L (ref 135–145)
Sodium: 143 mEq/L (ref 135–145)

## 2011-03-01 LAB — GLUCOSE, CAPILLARY
Glucose-Capillary: 107 mg/dL — ABNORMAL HIGH (ref 70–99)
Glucose-Capillary: 109 mg/dL — ABNORMAL HIGH (ref 70–99)
Glucose-Capillary: 126 mg/dL — ABNORMAL HIGH (ref 70–99)
Glucose-Capillary: 130 mg/dL — ABNORMAL HIGH (ref 70–99)
Glucose-Capillary: 137 mg/dL — ABNORMAL HIGH (ref 70–99)
Glucose-Capillary: 185 mg/dL — ABNORMAL HIGH (ref 70–99)
Glucose-Capillary: 77 mg/dL (ref 70–99)
Glucose-Capillary: 86 mg/dL (ref 70–99)

## 2011-03-01 LAB — PROTIME-INR
INR: 1.4 (ref 0.00–1.49)
Prothrombin Time: 15.8 seconds — ABNORMAL HIGH (ref 11.6–15.2)

## 2011-03-01 LAB — HEMOGLOBIN AND HEMATOCRIT, BLOOD
HCT: 35.6 % — ABNORMAL LOW (ref 36.0–46.0)
Hemoglobin: 11.8 g/dL — ABNORMAL LOW (ref 12.0–15.0)

## 2011-03-01 LAB — APTT: aPTT: 31 seconds (ref 24–37)

## 2011-04-04 NOTE — Discharge Summary (Signed)
Bianca Wyatt, NARVAIZ NO.:  [id number]   MEDICAL RECORD NO.:  [id number]          PATIENT TYPE:  INP   LOCATION:  1413                         FACILITY:  [facility]   PHYSICIAN:  Isidor Holts, M.D.  DATE OF BIRTH:  03-Dec-1933   DATE OF ADMISSION:  04/24/2008  DATE OF DISCHARGE:  04/30/2008                               DISCHARGE SUMMARY   PRIMARY CARE PHYSICIAN:  Previously [name]. [name], M.D. and [name], P.A., now [name], M.D.   DISCHARGE DIAGNOSES:  1. Right lower lobe pulmonary embolism/pulmonary infarct.  2. Morbid obesity.  3. Diet controlled type 2 diabetes mellitus.  4. Hypertension.  5. Chronic renal insufficiency.  6. Normocytic anemia.  Negative fecal occult blood testing.  7. A 3.9 mm right base subpleural nodule.   DISCHARGE MEDICATIONS:  1. Detrol LA 4 mg p.o. daily.  2. Ferrous sulfate 325 mg p.o. b.i.d.  3. Lisinopril 20 mg p.o. daily.  4. Norvasc 10 mg p.o. daily.  5. Tylenol 1 g p.o. p.r.n. q.8 hourly.  6. Coumadin 10 mg p.o. q. 6 p.m. daily otherwise per INR.  7. Lovenox 100 mg subcutaneously q.12 hourly until INR equal to 2-3      for 48 hours, then stop.   PROCEDURE:  1. Abdominal ultrasound scan dated April 25, 2008, which showed no acute      findings identified in the abdomen, gallbladder although was a      difficult study due to large body habitus and abundance of bowel      gas.  2. CT abdomen/pelvis dated April 24, 2008.  This showed findings      suspicious for pulmonary emboli in the right lower lobe.  No acute      abdominal findings.  No adenopathy.  No acute pelvic findings,      masses or adenopathy.  3. Chest CT angiogram dated April 24, 2008.  This showed acute pulmonary      embolism to the right lower lobe pulmonary artery with atelectasis      and consolidation right lower lobe consistent with pulmonary      infarct.  Right base nodule 3.9 mm.  Recommended a followup      examination in 6-12  months.   CONSULTATIONS:  None.   ADMISSION HISTORY:  Please see the H and P note of April 24, 2008,  dictated by Dr. [name].  However, in brief this is a 77 year old  female, with known history of type 2 diabetes mellitus, hypertension,  osteoarthritis, previous appendectomy/hysterectomy, previous bilateral  breast cysts, who presents with complaints of right upper quadrant  abdominal pain.  Imaging studies in the emergency department provided  findings suspicious for right lower lobe pulmonary embolism.  This was  subsequently confirmed by chest CT angiogram.  She was admitted for  further evaluation and investigation and management.   CLINICAL COURSE:  1. Pulmonary embolism.  For the details of presentation, refer to      admission history above.  The patient was treated with a      combination of subcutaneous Lovenox in therapeutic  dosage was well      as concomitant oral Coumadin treatment.  Her INR was somewhat tardy      to respond to this treatment, but, by April 30, 2008, INR had      climbed to 1.9.  The patient certainly felt considerably better,      had no further symptoms of right upper quadrant abdominal pain and      was not short of breath on exertion.  She was evaluated by physical      therapist and consideration was made for possible short term      skilled nursing facility.  However, the patient was adamantly      against this, and home health PT/OT as well as home health aide and      RN have been recommended in the alternative.   1. Type 2 diabetes mellitus.  The patient was not on any oral      hypoglycemic medications at the time of presentation.  Her      hemoglobin A1c during this hospitalization was 6.6.  She was      managed with carbohydrate modified diet, did not require any      supplemental insulin and remained euglycemic.  Her diabetes was      therefore deemed diet controlled.  She was recommended a      carbohydrate modified diet on discharge.   We anticipate her primary      medical doctor will monitor her blood glucose levels, as well as      hemoglobin A1c at appropriate intervals.   1. Hypertension.  This was managed with a combination of ACE inhibitor      and Norvasc during the course of this hospitalization.   1. Renal dysfunction.  The patient at the time of presentation had a      BUN of 18 with a creatinine of 1.25.  During the course of her      hospitalization, her renal indices remained stable and on April 30, 2008, BUN was 23 with a creatinine of 1.19.   1. Normocytic anemia.  The patient did present with normocytic anemia      with hemoglobin 11.1, MCV was 83.5.  She underwent iron studies      which showed iron 22, TIBC 197, percent saturation 11, ferritin      153, B12 was 268, folate level was 9.4.  Fecal occult blood testing      was negative.  The patient has been started on oral iron      supplements, and it was recommended that she have a GI referral for      possible endoscopic workup, following discharge.  She is actually      due for screening colonoscopy at this time.   1. Right lung nodule.  This was an incidental finding on chest CT      angiogram performed on April 24, 2008, which showed a 3.9 mm right      base subpleural nodule.  Radiologist's recommendation, was that      imaging followup should be arranged in 6-12 months.   DISPOSITION:  The patient was on April 30, 2008, clinically stable for  discharge and was discharged accordingly.   DIET:  Health healthy/carbohydrate modified.   ACTIVITY:  Recommended to increase activity slowly, otherwise per PT/OT.   FOLLOWUP INSTRUCTIONS:  The patient has requested referral to a new  primary medical doctor and she has therefore  been referred to Dr.  [name]. An appointment has been scheduled for May 01, 2008,  at 1:30 p.m.  Telephone number [phone].   SPECIAL INSTRUCTIONS:  The patient has been arranged home health  PT/OT/RN and  home health aide.  In addition, she is recommended to have  INR checked on May 01, 2008, May 02, 2008, and May 04, 2008, a.m. or  otherwise as specified by her primary medical doctor.  She is to  continue Lovenox subcu q.12 hourly until her INR is therapeutic at 2-3  for 48 hours, and then stop.  Monitoring of her PT/INR and  recommendations of Coumadin dosing will be deferred to her primary  medical doctor.      Isidor Holts, M.D.  Electronically Signed     CO/MEDQ  D:  04/30/2008  T:  04/30/2008  Job:  [id number]   cc:   [name], M.D.  Fax: [phone]   [name]. [name], M.D.  Fax: [phone]

## 2011-04-04 NOTE — Op Note (Signed)
Bianca Wyatt, Bianca Wyatt NO.:  [id number]   MEDICAL RECORD NO.:  [id number]          PATIENT TYPE:  INP   LOCATION:  0003                         FACILITY:  [facility]   PHYSICIAN:  Vanita Panda. Magnus Ivan, M.D.DATE OF BIRTH:  [birth year]   DATE OF PROCEDURE:  03/12/2009  DATE OF DISCHARGE:                               OPERATIVE REPORT   PREOPERATIVE DIAGNOSES:  Left knee severe degenerative joint  disease/osteoarthritis.   POSTOPERATIVE DIAGNOSES:  Left knee severe degenerative joint  disease/osteoarthritis.   PROCEDURE:  Left total knee arthroplasty utilizing computer navigation  assistance.   IMPLANTS:  DePuy rotating platform knee with size 3 femur, size 3 tibial  tray, 12.5 mm polyethylene insert, 32 mm patellar button.   SURGEON:  Vanita Panda. Magnus Ivan, M.D.   ASSISTANT:  Wende Neighbors, P.A.   ANESTHESIA:  1. Spinal.  2. Mask ventilation with sedation.   ANTIBIOTICS:  2 grams IV Ancef.   TOTAL TOURNIQUET TIME:  1 hour and 37 minutes.   BLOOD LOSS:  100 mL.   COMPLICATIONS:  None.   INDICATIONS:  Briefly Bianca Wyatt is a 77 year old female with known  severe osteoarthritis involving her left knee.  This has gotten to be so  severe that is was affecting activities of daily living.  I have seen  for quite some time and eventually got clearance to proceed with  surgery.  She had been on Coumadin for a long period of time and  eventually was able to  come off Coumadin.  Her knee had such as severe  deformity that she was having to walk with a walker and was having  tremendous pain.  The risks and benefits of surgery were explained to  her including the risk of DVT, PE, as well as acute blood loss anemia  and death.  She agreed to proceed with surgery.   PROCEDURE DESCRIPTION:  After informed consent was obtained, the  appropriate left leg was marked.  She was brought to operating room and  placed supine on the operating room table.  She was then  set up by  anesthesia and they obtained a good spinal anesthetic block.  She was  then laid back supine.  A nonsterile tourniquet was placed around her  upper left thigh and a Foley catheter was placed.  Her left knee was  then prepped and draped with DuraPrep and sterile drapes including a  sterile stockinette.  A leg holder was utilized as well, as well as the  lateral leg post.  A time-out was called and she was identified as the  correct patient and the correct left knee.  I then used Esmarch to wrap  out the leg and the tourniquet was inflated to 300 mm of pressure.  I  then took a midline approach to the knee with an centered directly over  the patella and carried proximally and distally.  I then dissected down  to the joint capsule and was able to perform a medial parapatellar  approach to the knee.  Once I opened up the knee you could see that  there was significant osteophytes  throughout the knee.  There were  several loose bodies and these were removed and I cleaned osteophytes  and soft tissue.  I then proceed with the navigation portion of the  case.  Through two small separate stab incisions in the distal medial  tibia I placed two Steinmann pins from an anteromedial to posterior  lateral direction.  Through the main incision up to the metaphysis  section of the femur two pins were also placed from anteromedial to  posterior lateral.  This afforded computer navigation and the navigation  arrays were made and then we selected points throughout the knee, as  well as the rotation of the hip to get a good computer-generated map of  the knee.  She had a significant amount of varus deformity and it was  quite tight with a flexion contracture as well.  I then released tissue  on the medial side and was able to loosen her up.  I removed the ACL and  PCL.  The computer helped [place] make decision about our tibial cut.  Using  computer guide we then used the cutting blocks and took 8 mm off  the  high side and 10 mm off the low side.  This was corrected according to  our plan.  This helped [place] with flexion and extension.  We then made our  distal femoral cut and I had equal gaps in flexion and extension once we  loosened on the medial side as well.  We then chose a size 3 femur to  make our anterior and posterior cuts, as well as our chamfer cuts.  A  box cut was then made and then we sized a 3 tibia and made our post cut,  as well as our keel cut.  A trial size 3 tibial tray with a size 3 femur  were then placed.  I placed a 10 mm polyethylene insert.  We felt the  knee was a little loose with the 10 mm, but when we went up to 12.5 mm  this was stable.  I next measured the patella to be about 25 mm in  thickness and so we took around 16 mm off of this and then placed drill  blocks with a 32 patellar button.  Once all trial components were in  place we put the knee through range of motion under irrigation and found  it to be full, as well as the stable.  Clinically, it felt stable as  well.  I then removed all trial components and thoroughly irrigated the  knee using pulsatile lavage.  We then cemented the real rotating  platform tibial tray size 3, followed by the real femoral component size  3,  The real 12.5-mm polyethylene insert was placed, as well as the 32  mm patellar polyethylene button.  Once this was hardened we let the  tourniquet down at 1 hour and 37 minutes.  Hemostasis was obtained with  electrocautery.  We then thoroughly irrigated the knee again with  pulsatile lavage.  A medium Hemovac drain was placed deeply and we  closed the joint/arthrotomy with interrupted #1 Vicryl suture, followed  by 2-0 Vicryl, subcutaneous tissue in a running suture of 4-0 Vicryl  subcuticular stitch.  Steri-Strips were placed, followed by well-padded  sterile dressing.  Of note, we did remove the pins from the femur and  the tibia as well.   Postoperatively, we will allow her  knee to be placed in CPM for range of  motion.  Again, she was taken to the recovery room in stable condition.      Vanita Panda. Magnus Ivan, M.D.  Electronically Signed     CYB/MEDQ  D:  03/12/2009  T:  03/12/2009  Job:  [id number]

## 2011-04-04 NOTE — Discharge Summary (Signed)
Bianca Wyatt, COLLISTER NO.:  [id number]   MEDICAL RECORD NO.:  [id number]          PATIENT TYPE:  INP   LOCATION:  1603                         FACILITY:  [facility]   PHYSICIAN:  Vanita Panda. Magnus Ivan, M.D.DATE OF BIRTH:  11/12/1934   DATE OF ADMISSION:  03/12/2009  DATE OF DISCHARGE:  03/15/2009                               DISCHARGE SUMMARY   ADMITTING DIAGNOSES:  Severe osteoarthritis and degenerative joint  disease, left knee.   SECONDARY ADMITTING DIAGNOSES:  1. Diet-controlled diabetes.  2. Increased lipids.  3. Hypertension.  4. History of pulmonary embolus.  5. History of anemia.   DISCHARGE DIAGNOSIS:  Status post left total knee arthroplasty.   PROCEDURES:  Left total knee arthroplasty on March 11, 2009.   HOSPITAL COURSE:  Briefly [name] is a 77 year old female with  severe debilitating arthritis involving her left knee.  After failure of  conservative treatment it was recommended that she undergo a total knee  replacement.  The risks and benefits of this were explained to her. She  understood and wished to proceed with surgery.   PROCEDURE PERFORMED:  She was taken to the operating room on the day of  admission where she underwent a left total knee arthroplasty without  complications.  For a detailed description of the operation please refer  to the operative note in the patient's medical record.  Postoperatively  the patient did well and was admitted to a regular orthopedic floor bed.  Her postoperative course was uncomplicated.  She did have an episode of  acute blood loss anemia with hemoglobin down to 8.5 but was asymptomatic  from this with normal vital signs and did not require transfusion.  By  day of discharge it was felt that she could be discharged safely to  skilled nursing facility with continued physical therapy and  occupational therapy.  She was transitioned to a regular diet and was  tolerating oral pain medicines, and it  was felt she could discharged  safely to the skilled nursing facility.   DISCHARGE MEDICATIONS:  1. Coumadin 5 mg p.o. daily at 1800 hours with adjustments made for      target INR to 2-3.  2. Percocet 5/225 one to two p.o. q.6-8 h. p.r.n. pain.  3. Robaxin 500 mg p.o. q.6 h. p.r.n. spasms.  4. Norvasc 10 mg p.o. daily  5. Enablex 7.5 mg p.o. daily.  6. Senokot 1 tablet p.o. b.i.d. a.c. p.r.n.  7. Iron sulfate 300 mg p.o. t.i.d. with meals for 1 week.   DISCHARGE INSTRUCTIONS:  While she is in the skilled nursing facility  she is continue her ongoing physical therapy and occupational therapy on  range of motion of her knee, balance coordination and strengthening.  Follow-up appointment should be with Dr. Eliberto Ivory office within 2  weeks of discharge.  Dry dressings can be applied daily to her knee and  she shower and get her knee wet starting March 17, 2009.      Vanita Panda. Magnus Ivan, M.D.  Electronically Signed     CYB/MEDQ  D:  03/15/2009  T:  03/15/2009  Job:  [id number] 

## 2011-04-04 NOTE — Discharge Summary (Signed)
Bianca Wyatt, EFAW NO.:  [id number]   MEDICAL RECORD NO.:  [id number]           PATIENT TYPE:   LOCATION:                                 FACILITY:   PHYSICIAN:  Herbie Saxon, MDDATE OF BIRTH:  January 06, 1934   DATE OF ADMISSION:  DATE OF DISCHARGE:                               DISCHARGE SUMMARY   DISCHARGE DIAGNOSES:  1. Coumadin toxicity, reversed.  2. Diabetes, stable.  3. Hypertension, stable.  4. Chronic renal failure, stable.  5. Atypical chest pain, cardiac cause ruled out.  6. History of pulmonary embolism.  7. Morbid obesity.  8. Mild anemia.  9. History of subpleural nodule.   HOSPITAL COURSE:  This 77 year old lady presented to the emergency room  complaining of squeezing chest pain.  Serial cardiac enzymes and EKG  have been negative on this admission.  She was noted to be having a  supratherapeutic INR of 7.4.  She was started on low-dose vitamin K 2.5  mg at admission and her Coumadin has been held till today.  INR is 9,  the therapeutic being 2.0.  There has been no bleeding episode.  She is  asymptomatic and not having any chest pain.   DISCHARGE CONDITION:  Stable.   DIET:  Heart healthy, 1800-calorie ADA, low cholesterol.   ACTIVITY:  To be increased slowly as tolerated.   FOLLOWUP:  She is to follow up with primary care physician, Dr. [name], in 3-5 days to monitor her Coumadin treatment, keep it in a 2-3  range, to arrange cardiac workup including echocardiogram as outpatient,  and stress test as an outpatient.   DISCHARGE MEDICATIONS:  1. Detrol 4 mg daily.  2. Norvasc 10 mg daily.  3. Lisinopril 20  mg daily.  4. Coumadin 5 mg daily.  This is to be titrated as needed by the      primary care physician.  5. Iron sulfate 325 mg twice daily.  6. Vicodin one tablet q.6 h. p.r.n.  7. Tylenol 500 mg q.8 h. p.r.n.   PHYSICAL EXAMINATION:  GENERAL:  On examination,  she is an elderly  lady, not in acute  distress.  VITAL SIGNS:  Temperature is 97, pulse 78, respiratory rate is 18, and  blood pressure 133/70.  HEENT:  Pupils are equal, reacting to light and accommodation.  NECK:  Supple.  Oropharynx and nasopharynx are clear.  There is no  elevated JVD or thyromegaly.  No lymphadenopathy.  HEAD:  Head is atraumatic, normocephalic, and no jaundice.  HEART:  Heart sounds 1 and 2, regular.  No murmurs, gallops, or rubs.  CHEST:  Clinically clear.  ABDOMEN:  Benign.  No organomegaly.  EXTREMITIES:  The peripheral pulses are present.  No pedal edema.  NEURO:  Cranial nerves II-XII are intact.  Power is 5/5 in all limbs.  Normal muscle tone and bulk.  No joint swelling.  No skin rash.   LABORATORY DATA:  INR is 2.0, PT 23.  Chemistry shows a sodium of 142,  potassium 4.2, chloride 112, bicarbonate 23, glucose is 115, BUN 19,  creatinine 1.0.  WBC 7.6, hematocrit 33.2, platelet count is 398.  EKG  normal sinus rhythm expect one abnormality with nonspecific AV block.      Herbie Saxon, MD  Electronically Signed     MIO/MEDQ  D:  05/14/2008  T:  05/15/2008  Job:  [id number]   cc:   [name], M.D.

## 2011-04-04 NOTE — H&P (Signed)
Bianca Wyatt, Bianca Wyatt NO.:  [id number]   MEDICAL RECORD NO.:  [id number]          PATIENT TYPE:  EMS   LOCATION:  ED                           FACILITY:  [facility]   PHYSICIAN:  Hettie Holstein, D.O.    DATE OF BIRTH:  1934/03/15   DATE OF ADMISSION:  04/24/2008  DATE OF DISCHARGE:                              HISTORY & PHYSICAL   PRIMARY CARE PHYSICIAN:  She sees physician's assistant of Dr. Andi Devon, Eddie Candle PA.   CHIEF COMPLAINT:  Right upper quadrant pain.   HISTORY OF PRESENTING ILLNESS:  Bianca Wyatt is a very pleasant 77-year-  old morbidly obese female who carries the diagnoses of diabetes and  hypertension,, who in her usual state of health up until this morning,  when she had a sudden onset of right upper quadrant pain; this was  associated with cough or shortness of breath.  She had some improvement  with repositioning.  She presented to the emergency department around 8  o'clock this morning and underwent evaluation for possible gallbladder  or abdominal etiology.  Dr. Ethelda Chick performed CT scan of her abdomen  and pelvis and there were some suspicious findings in the upper images  of scan revealing the basis of the lung; that prompted further  investigations with a dedicated CT scan.  A CT angiogram of her chest  was performed and it was identified that she had a large acute right  lower lobe pulmonary artery embolus and a right lower lobe pulmonary  infarct.  She is being admitted for further management.   Upon further questioning, she denied any of recent travel, immobility or  trauma or injury that she is aware.  She had a recent diarrheal event,  but this has not persistent and is no longer present.   PAST MEDICAL HISTORY:  Significant for:  1. Diabetes for which she was discontinued on her medications.  2. Hypertension.  3. Arthritis.  4. Status post appendectomy and hysterectomy in the past; she cannot      recall whether or not  she has had an oophorectomy.  5. She has had bilateral breast cysts in the past.   Otherwise, she denies any other medical history.   MEDICATIONS:  She does not know the dosages.  She uses [employer],  which can be contacted once on the floor.  She takes Detrol, Tylenol,  Norvasc and lisinopril; these dosages are not known.   ALLERGIES:  She has no known drug allergies.   SOCIAL HISTORY:  She lives at home alone with a home health aide that  stays with her for about 8 hours per day.  She denies tobacco or  alcohol.  She is a widow.  Her son Onalee Hua is here with her, reachable at  [phone].   FAMILY HISTORY:  Both of her parents lived into their late 62s, no  diseases described at the early age.  She has lost 1 daughter, age 24,  with a sudden stroke.  Three boys who are currently all healthy and  alive and well.   REVIEW OF SYSTEMS:  She  has had some increased swelling of legs for the  past week or so.  Episode of diarrhea.  No chest pain or shortness of  breath.  No blood in her stools.  Further review of systems is  unremarkable.   PHYSICAL EXAMINATION:  VITAL SIGNS: In the emergency department, she had  a T-max of 100.5, her blood pressure was 166/84, heart rate 74,  respirations 18 and O2 saturation 97%.  HEENT:  Revealed her head to be normocephalic, atraumatic.  Extraocular  muscles were intact.  NECK:  Supple and nontender.  There was no palpable mass or thyromegaly.  CARDIOVASCULAR:  Exam reveals a normal S1-S2 without appreciable murmur.  LUNGS:  She exhibited normal effort.  There was no dullness to  percussion.  Breath sounds were clear bilaterally, except diminished in  her right base.  ABDOMEN:  Obese, nontender.  No rebound or guarding.  LOWER EXTREMITIES: Revealed obesity, only mild trace ankle edema.  NEUROLOGIC:  Exam revealed her to be alert and oriented x3 and no focal  neurologic deficit.   IMAGING STUDY:  CT scan, as noted above, revealed an acute PE in  the  right lower lobe and pulmonary artery and right lower lobe pulmonary  infarct.  She had a 3.9-mm nodule which the radiologist recommended a 6-  to 65-month followup   CT ultrasound of her abdomen was negative.   LABORATORY DATA:  Her urinalysis was negative as well.  Lipase was 23.  Sodium 144, potassium 4, BUN 18, creatinine 1.25 and glucose 156.  AST  and ALT were 24/19, albumin 3.4. WBC of 9.9, hemoglobin 11.1 and  platelet count 339,000,  MCV of 83.   ASSESSMENT:  1. Provoked/spontaneous pulmonary embolus, large, in right lower lobe      with pulmonary infarct.  2. Morbid obesity.  3. Hypertension.  4. Diabetes, not currently on medication.  5. Renal insufficiency.  6. Normocytic anemia.  7. A 3.9-mm left lung base nodule.  Recommend followup in 6-12 months.   PLAN:  At this time, Bianca Wyatt will be anticoagulated with the  assistance of Pharmacy to adjust the dosing.  We will continue her home  medications after reconciliation.  We will continue to manage her  symptoms and follow her O2 saturations.      Hettie Holstein, D.O.  Electronically Signed     ESS/MEDQ  D:  04/24/2008  T:  04/25/2008  Job:  [id number]   cc:   [name]. [name], M.D.  Fax: [phone]

## 2011-04-04 NOTE — H&P (Signed)
Bianca Wyatt, Bianca Wyatt NO.:  [id number]   MEDICAL RECORD NO.:  [id number]          PATIENT TYPE:  INP   LOCATION:  4735                         FACILITY:  [facility]   PHYSICIAN:  Thomasenia Bottoms, MDDATE OF BIRTH:  04/12/1934   DATE OF ADMISSION:  05/11/2008  DATE OF DISCHARGE:                              HISTORY & PHYSICAL   CHIEF COMPLAINT:  Chest pain.   HISTORY OF THE PRESENTING ILLNESS:  This patient is a 77 year old woman  who presented to the emergency department by EMS tonight because of a  squeezing chest pain.  She denies any nausea, diaphoresis, fever or  cough.  The patient states that she hurt for a total of about 2 hours.  The pain was relieved by two sublingual nitroglycerins given by EMS.  The patient has never had pain like this before.  She was diagnosed with  a pulmonary embolism approximately 2 weeks ago, but she states that this  pain is very different.   PAST MEDICAL HISTORY:  The patient's past medical history is significant  for:  1. The pulmonary embolism diagnosed in June 2009.  2. The patient has a history of diabetes mellitus Type 2, but no      longer requires any medication for this.  3. The patient has a history of hypertension.  4. Mild renal insufficiency, but her creatinine on April 29, 2008 was      normal at 1.17.  5. The patient also has a history of mild anemia; and,  6. History of a 3.9 mm subpleural nodule.   MEDICATIONS:  The patient's medications on arrival  (please note this  medication list comes from her discharge summary of 2 weeks ago at the  time she was discharged) and include:  1. Detrol LA 4 mg daily.  2. Coumadin 10 mg daily.  3. Norvasc 10 mg daily.  4. Lisinopril 20 mg daily.  5. Ferrous sulfate 325 mg twice a day.   FAMILY HISTORY:  The family history is significant for no pulmonary  embolism and no MIs.   SOCIAL HISTORY:  The patient does not smoke cigarettes, drink alcohol or  use any illicit  drugs.  She lives by herself.  She is widowed.   REVIEW OF SYSTEMS:  CONSTITUTIONAL:  Appetite is excellent.  No night  sweats.  No weight loss.  HEENT:  No headaches.  No sore throat.  CARDIOVASCULAR:  The patient has occasional lower extremity edema.  She  had the chest pain tonight.  RESPIRATORY: No shortness of breath.  No  wheezing.  No hemoptysis.  GASTROINTESTINAL: No diarrhea.  No  constipation. No bright red blood per rectum.  No vomiting blood.  GENITOURINARY: Negative.  PSYCHIATRIC:  Negative.  MUSCULOSKELETAL: She  does have arthritis and reports pain and even weakness in her left hip.  All other systems are reviewed and are negative.   PHYSICAL EXAMINATION:  VITAL SIGNS:  In the emergency department her  temperature is 98.1, blood pressure 109/38, pulse 90, respiratory rate  19, and O2 sat 99% on room air.  GENERAL APPEARANCE:  On physical examination  the patient is well-  nourished and well-developed, and in no acute distress.  HEENT:  Examination shows normocephalic and atraumatic.  Pupils are  equal and round.  Sclerae nonicteric.  Oral mucosa moist.  She does not  have any  teeth.  NECK:  The neck is supple.  No lymphadenopathy, no thyromegaly and no  jugular venous distention.  HEART:  The patient's cardiac exam shows a regular rate and rhythm with  no murmurs, gallops or rubs.  LUNGS:  The patient's lungs are clear to auscultation bilaterally with  no wheezes, rhonchi or rales.  ABDOMEN:  The patient's abdomen is soft, nontender and nondistended.  Normoactive bowel sounds.  No masses are appreciated.  The patient has  no hepatosplenomegaly.  EXTREMITIES:  The patient's extremities reveal no evidence of clubbing,  cyanosis or edema.  NEUROLOGIC EXAMINATION:  Neurologically she is alert and oriented x3.  Her cranial nerves II-XII are intact grossly.  She has 5/5 strength in  her upper and lower extremities.  Sensory exam is intact grossly in her  upper lower  extremities.  She has normal muscle tone and bulk.  MUSCULOSKELETAL:  The patient's musculoskeletal examination reveals no  evidence of effusion in her joints.   LABORATORY DATA:  The patient's PTT is prolonged at 85 seconds her INR  is 7.4 and her PT is 66.6.  Her troponin is less than 0.05.  CK/MB is  1.8.  Her sodium is 142, potassium is 3.5, chloride is 110, glucose is  136, BUN is 34, and creatinine is 1.7.  Her hemoglobin is 11.6 and  hematocrit is 34.0.  Her EKG reveals normal sinus rhythm with a rate of  87.  She has some LVH with no ST-segment elevation or depression.  Her  chest x-ray reveals no acute abnormality.   ASSESSMENT/PLAN:  1. Atypical chest pain in a woman who recently diagnosed with      pulmonary embolism.  We will go ahead and admit her overnight for      24-hour observation.  We will rule her out for MI by cardiac      enzymes.  We will put on telemetry overnight.  No stress test is      planned at this point because of her recent pulmonary embolism.  2. Pulmonary embolism.  Her INR is supertherapeutic at this point and      she already took her Coumadin 10 mg tonight; so, we will hold her      Coumadin going forth for a couple of days.  The ED physician did      give her a dose of vitamin K.  She has no evidence of active      bleeding at this time.  She does not have a clear reason why she      had the pulmonary embolism so she certainly she have a full      malignancy workup done as an outpatient.  She did have a CT scan of      her abdomen and pelvis on arrival on arrival at her last      hospitalization, which did not reveal any malignancy.  She did have      this 3.9 mm subpleural nodule, which should be followed up; and, if      she has not had a mammogram recently she should have that.  3. Hypertension.  We will continue her Norvasc and her lisinopril,      though her blood pressure  is a little on the low side this evening      and we may need to give  her a small amount of  IV fluids also      because her creatinine is elevated.  4. Diabetes.  The patient is no longer requiring any medications, but      we will follow her blood sugars while she is here in the hospital.      Thomasenia Bottoms, MD  Electronically Signed     CVC/MEDQ  D:  05/12/2008  T:  05/12/2008  Job:  [id number]   cc:   [name], M.D.

## 2011-08-17 LAB — CBC
HCT: 29.7 — ABNORMAL LOW
HCT: 33.4 — ABNORMAL LOW
HCT: 30.4 — ABNORMAL LOW
HCT: 33 — ABNORMAL LOW
Hemoglobin: 10.4 — ABNORMAL LOW
Hemoglobin: 9.9 — ABNORMAL LOW
Hemoglobin: 10 — ABNORMAL LOW
Hemoglobin: 10.9 — ABNORMAL LOW
MCHC: 33.1
MCHC: 32.8
MCHC: 33.1
MCV: 82.9
MCV: 83.5
MCV: 83.6
MCV: 84.1
MCV: 83.6
MCV: 84.7
Platelets: 339
Platelets: 372
Platelets: 383
Platelets: 398
RBC: 3.55 — ABNORMAL LOW
RBC: 3.56 — ABNORMAL LOW
RBC: 3.69 — ABNORMAL LOW
RBC: 3.59 — ABNORMAL LOW
RBC: 3.94
RDW: 15.4
RDW: 15.1
RDW: 15.5
WBC: 6.1
WBC: 6.4
WBC: 6.8
WBC: 7.6

## 2011-08-17 LAB — RETICULOCYTES
RBC.: 3.74 — ABNORMAL LOW
Retic Count, Absolute: 33.7
Retic Ct Pct: 0.9

## 2011-08-17 LAB — BASIC METABOLIC PANEL
BUN: 11
BUN: 19
BUN: 26 — ABNORMAL HIGH
CO2: 19
CO2: 20
CO2: 21
CO2: 21
CO2: 23
Calcium: 8.9
Calcium: 8.8
Calcium: 9.3
Chloride: 109
Chloride: 109
Chloride: 112
Chloride: 112
Chloride: 115 — ABNORMAL HIGH
Creatinine, Ser: 1.13
Creatinine, Ser: 1.19
Creatinine, Ser: 1.07
Creatinine, Ser: 1.34 — ABNORMAL HIGH
GFR calc Af Amer: 54 — ABNORMAL LOW
GFR calc Af Amer: 55 — ABNORMAL LOW
GFR calc Af Amer: 47 — ABNORMAL LOW
GFR calc Af Amer: >60
GFR calc non Af Amer: 39 — ABNORMAL LOW
GFR calc non Af Amer: 51 — ABNORMAL LOW
Glucose, Bld: 105 — ABNORMAL HIGH
Glucose, Bld: 115 — ABNORMAL HIGH
Glucose, Bld: 130 — ABNORMAL HIGH
Potassium: 3.8
Potassium: 3.5
Potassium: 4.2
Sodium: 136
Sodium: 141
Sodium: 142
Sodium: 144

## 2011-08-17 LAB — PROTIME-INR
INR: 1
INR: 1.2
INR: 1.2
INR: 1.7 — ABNORMAL HIGH
INR: 1.9 — ABNORMAL HIGH
INR: 2 — ABNORMAL HIGH
INR: 5.6
INR: 7.4
INR: 7.9
Prothrombin Time: 17.2 — ABNORMAL HIGH
Prothrombin Time: 22.6 — ABNORMAL HIGH
Prothrombin Time: 23.4 — ABNORMAL HIGH
Prothrombin Time: 52.9 — ABNORMAL HIGH
Prothrombin Time: 69.8 — ABNORMAL HIGH

## 2011-08-17 LAB — CARDIAC PANEL(CRET KIN+CKTOT+MB+TROPI)
CK, MB: 0.8
CK, MB: 0.8
CK, MB: 0.9
Relative Index: 0.6
Relative Index: 0.7
Relative Index: 0.7
Total CK: 119
Total CK: 128
Total CK: 135
Troponin I: 0.01
Troponin I: 0.01
Troponin I: 0.01

## 2011-08-17 LAB — IRON AND TIBC
Iron: 22 — ABNORMAL LOW
TIBC: 197 — ABNORMAL LOW

## 2011-08-17 LAB — DIFFERENTIAL
Eosinophils Absolute: 0.1
Eosinophils Relative: 2
Lymphocytes Relative: 11 — ABNORMAL LOW
Lymphocytes Relative: 26
Lymphs Abs: 1.6
Monocytes Absolute: 0.5
Monocytes Absolute: 0.5
Monocytes Relative: 5
Monocytes Relative: 8
Neutro Abs: 7.9 — ABNORMAL HIGH

## 2011-08-17 LAB — URINALYSIS, ROUTINE W REFLEX MICROSCOPIC
Bilirubin Urine: NEGATIVE
Glucose, UA: NEGATIVE
Nitrite: NEGATIVE
Specific Gravity, Urine: 1.019
pH: 5

## 2011-08-17 LAB — HEMOGLOBIN A1C: Hgb A1c MFr Bld: 6.6 — ABNORMAL HIGH

## 2011-08-17 LAB — LIPID PANEL
LDL Cholesterol: 120 — ABNORMAL HIGH
Triglycerides: 76

## 2011-08-17 LAB — URINE MICROSCOPIC-ADD ON

## 2011-08-17 LAB — COMPREHENSIVE METABOLIC PANEL
Albumin: 3.4 — ABNORMAL LOW
BUN: 18
Creatinine, Ser: 1.25 — ABNORMAL HIGH
Potassium: 4
Total Protein: 8

## 2011-08-17 LAB — APTT
aPTT: 31
aPTT: 45 — ABNORMAL HIGH
aPTT: 85 — ABNORMAL HIGH

## 2011-08-17 LAB — POCT I-STAT, CHEM 8
Calcium, Ion: 1.21
HCT: 34 — ABNORMAL LOW
Sodium: 142
TCO2: 23

## 2011-08-17 LAB — OCCULT BLOOD X 1 CARD TO LAB, STOOL: Fecal Occult Bld: NEGATIVE

## 2011-08-17 LAB — POCT CARDIAC MARKERS
Myoglobin, poc: 133
Operator id: 277751

## 2011-08-17 LAB — FERRITIN: Ferritin: 153 (ref 10–291)

## 2011-08-21 LAB — URINALYSIS, ROUTINE W REFLEX MICROSCOPIC
Bilirubin Urine: NEGATIVE
Glucose, UA: NEGATIVE
Hgb urine dipstick: NEGATIVE
Ketones, ur: NEGATIVE
Nitrite: NEGATIVE
Protein, ur: NEGATIVE
Specific Gravity, Urine: 1.015
Urobilinogen, UA: 1
pH: 6.5

## 2011-08-21 LAB — CBC
Hemoglobin: 11 — ABNORMAL LOW
MCHC: 32.8
MCV: 86.8
RBC: 3.86 — ABNORMAL LOW

## 2011-08-21 LAB — DIFFERENTIAL
Basophils Relative: 0
Eosinophils Absolute: 0.1
Monocytes Absolute: 0.2
Monocytes Relative: 2 — ABNORMAL LOW

## 2011-08-21 LAB — APTT: aPTT: 34

## 2011-08-21 LAB — GLUCOSE, CAPILLARY: Glucose-Capillary: 118 — ABNORMAL HIGH

## 2011-08-21 LAB — URINE MICROSCOPIC-ADD ON

## 2011-08-21 LAB — BASIC METABOLIC PANEL
CO2: 23
Chloride: 110
Creatinine, Ser: 1.14
GFR calc Af Amer: 57 — ABNORMAL LOW

## 2011-08-21 LAB — URINE CULTURE

## 2012-03-05 IMAGING — CR DG CHEST 2V
1 series · 1 of 1 positions shown · non-contrast
Comparison: Chest x-ray 05/11/2008.

CLINICAL DATA: Syncopal episode.

CHEST - 2 VIEW

[view not recorded]
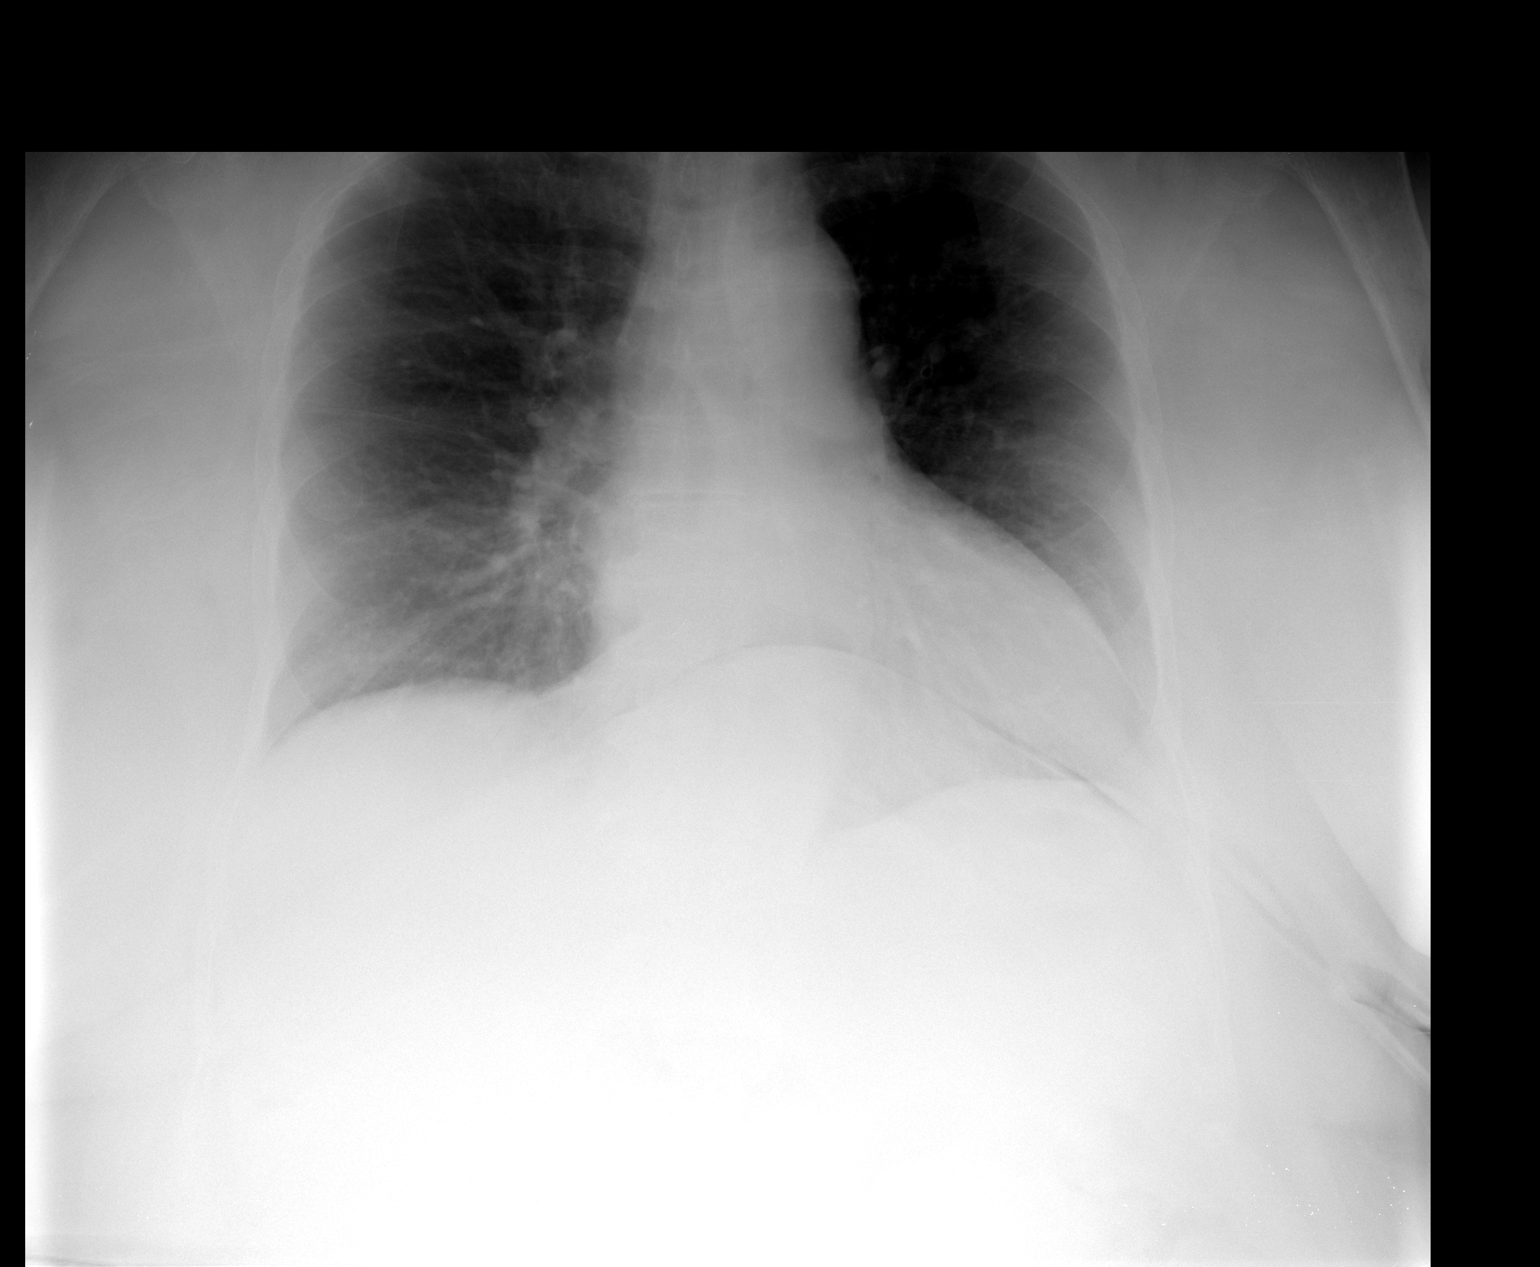

[1 of 1 positions shown; findings below may reference images not displayed]

FINDINGS: The heart is mildly enlarged but stable.  The mediastinal
and hilar contours are within normal limits and unchanged.  There
are mild chronic bronchitic type lung changes but no acute
pulmonary findings.  No pleural effusions.  The bony thorax is
intact.
IMPRESSION: No acute cardiopulmonary findings.

## 2012-03-05 IMAGING — CT CT HEAD W/O CM
1 of 2 series · 13 of 30 positions shown, 17 images · non-contrast
Comparison: 09/13/2008.

CLINICAL DATA: Syncopal episode.  Dizziness.

CT HEAD WITHOUT CONTRAST
TECHNIQUE: Contiguous axial images were obtained from the base of
the skull through the vertex without contrast.

[Series 2: brain · axial · 0.47mm/px · z∈[+105,+239]mm · 13 of 32 slices shown, 17 images]
[im 3/32  brain]
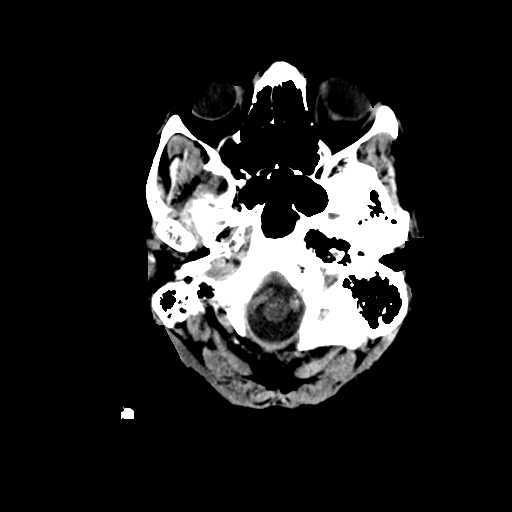
[im 3/32  bone]
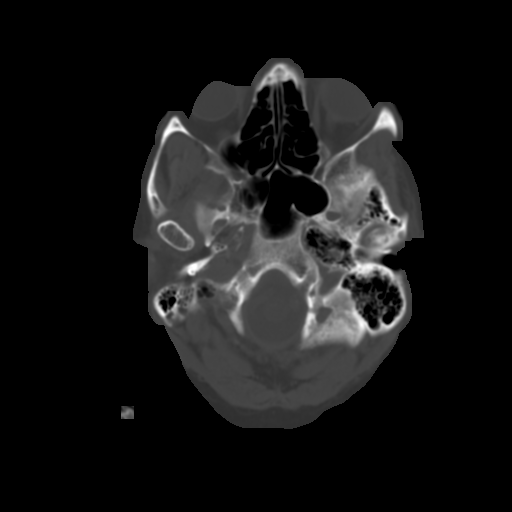
[im 5/32  brain]
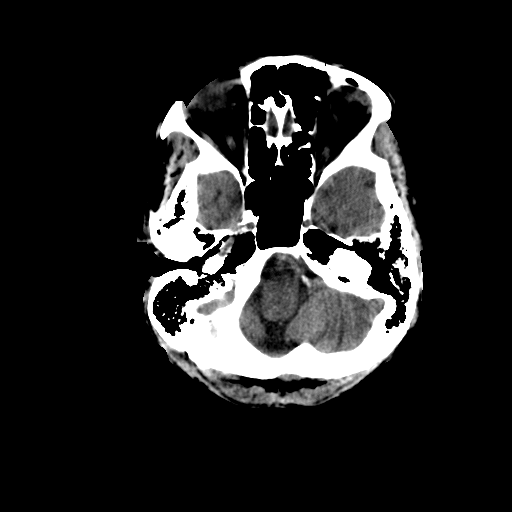
[im 7/32  brain]
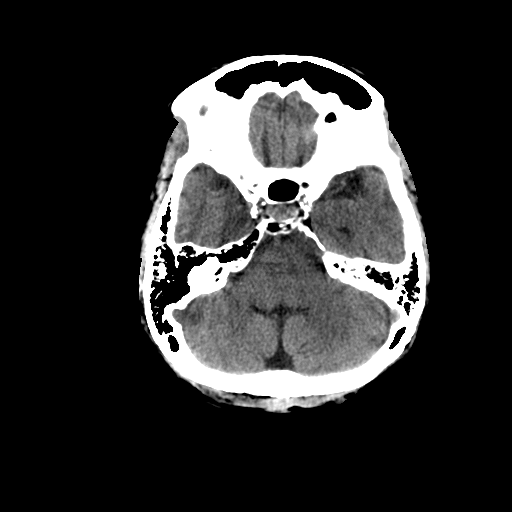
[im 9/32  brain]
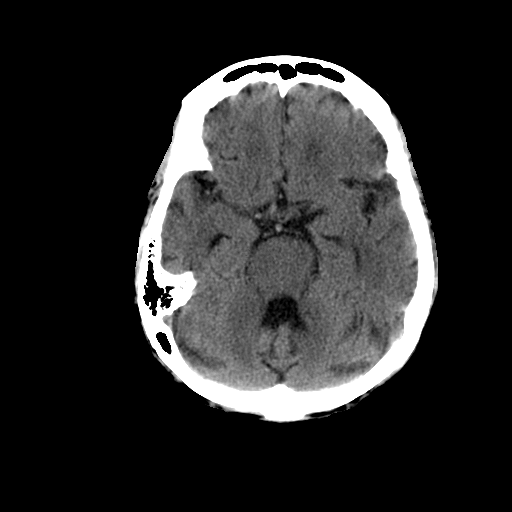
[im 12/32  brain]
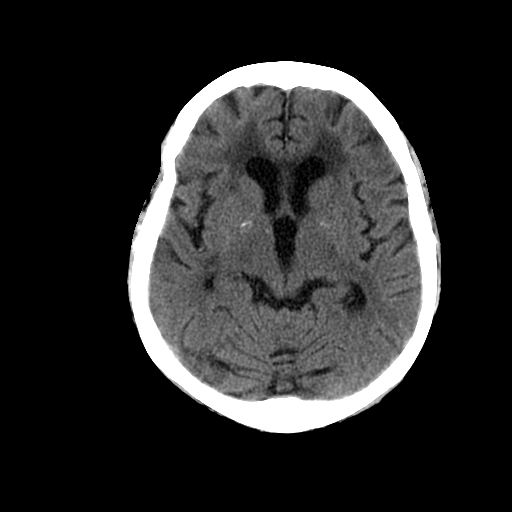
[im 12/32  bone]
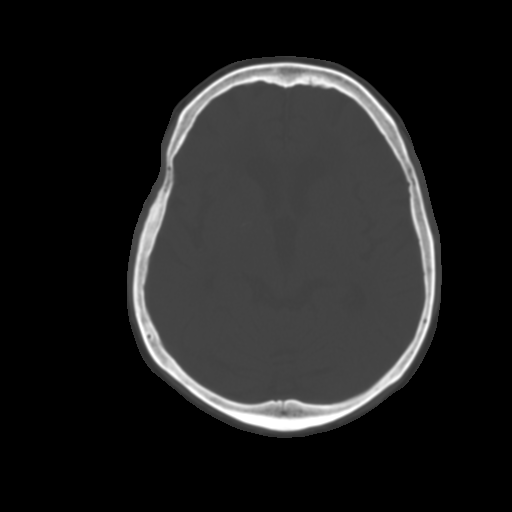
[im 14/32  brain]
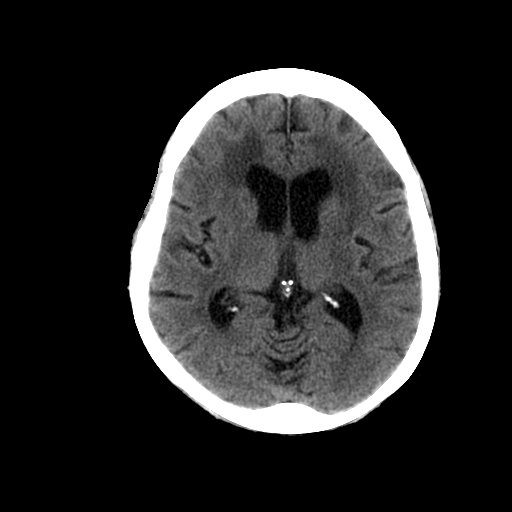
[im 16/32  brain]
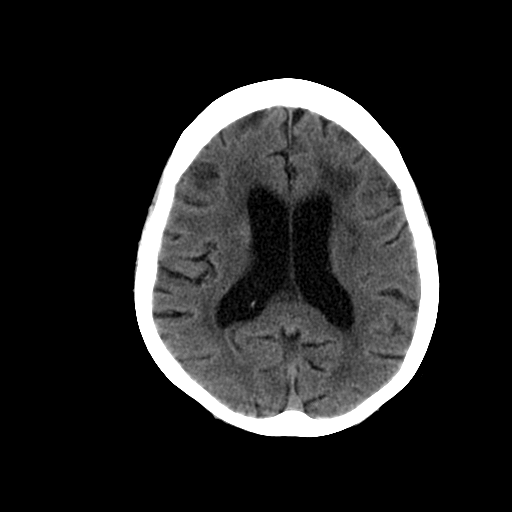
[im 18/32  brain]
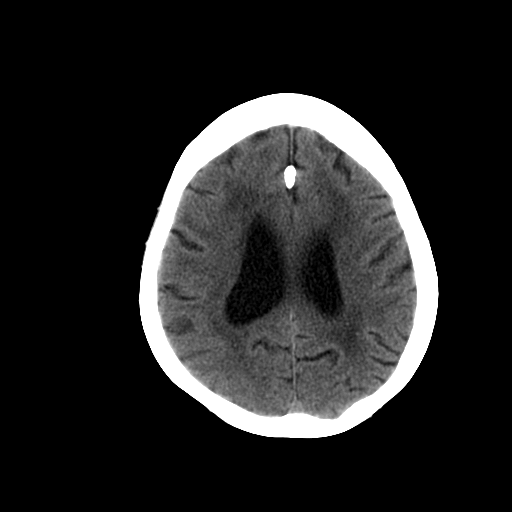
[im 20/32  brain]
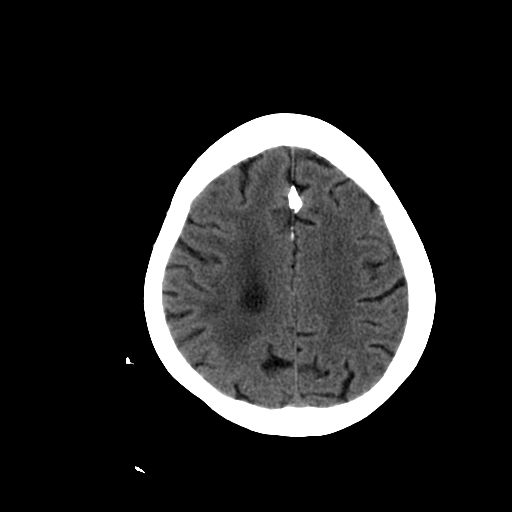
[im 20/32  bone]
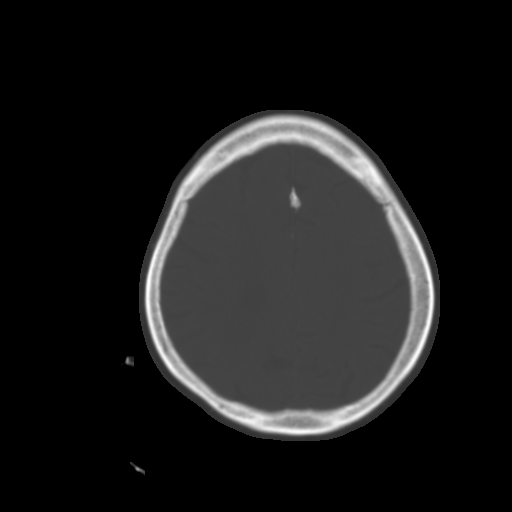
[im 23/32  brain]
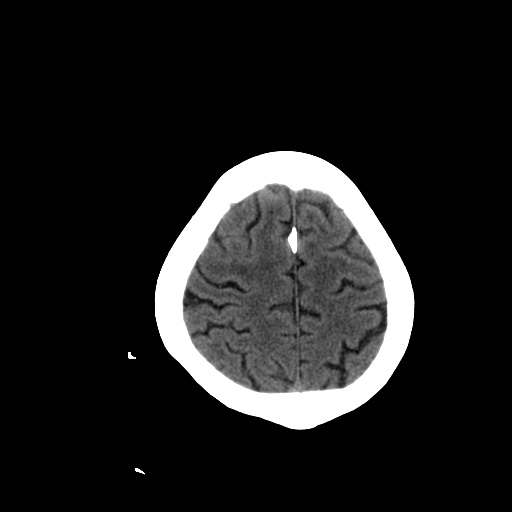
[im 25/32  brain]
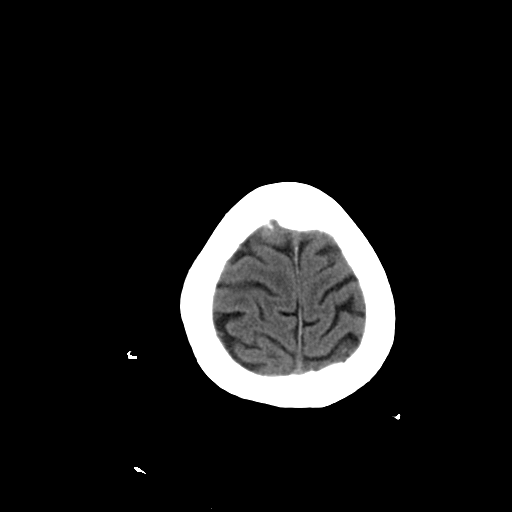
[im 27/32  brain]
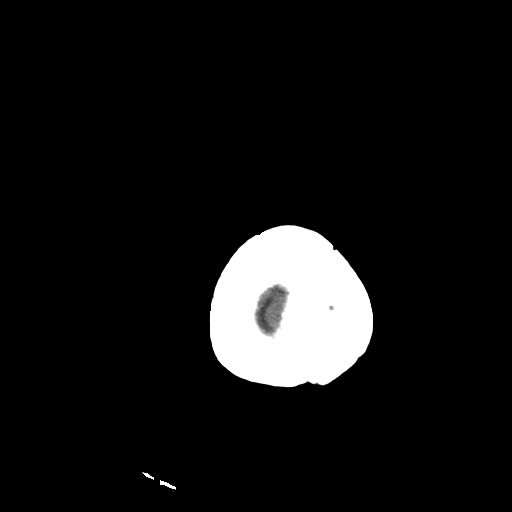
[im 29/32  brain]
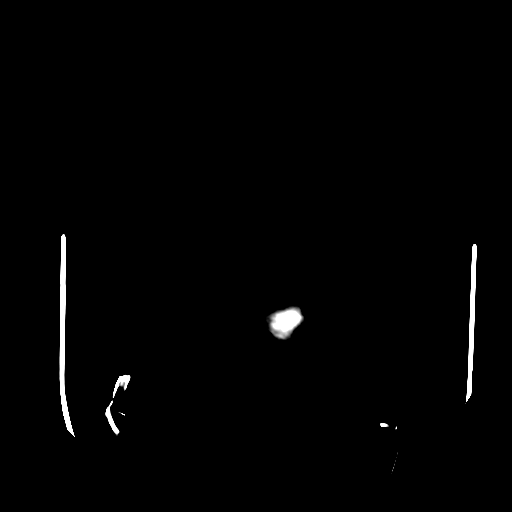
[im 29/32  bone]
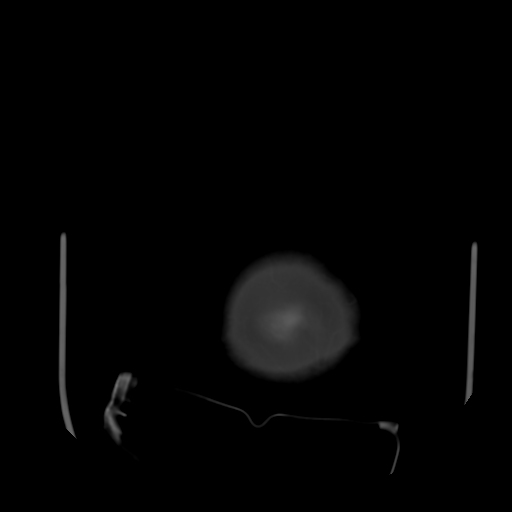

[13 of 30 positions shown; findings below may reference images not displayed]

FINDINGS: Stable enlarged ventricles and subarachnoid spaces.
Stable patchy white matter low density in both cerebral
hemispheres.  No intracranial hemorrhage, mass lesion or CT
evidence of acute infarction.  Unremarkable bones and included
paranasal sinuses.
IMPRESSION: Stable atrophy and chronic small vessel white matter ischemic
changes.  No acute abnormality.

## 2013-02-24 ENCOUNTER — Non-Acute Institutional Stay (SKILLED_NURSING_FACILITY): Payer: Medicare Other | Admitting: Internal Medicine

## 2013-02-24 DIAGNOSIS — N039 Chronic nephritic syndrome with unspecified morphologic changes: Secondary | ICD-10-CM

## 2013-02-24 DIAGNOSIS — D631 Anemia in chronic kidney disease: Secondary | ICD-10-CM

## 2013-02-24 DIAGNOSIS — J309 Allergic rhinitis, unspecified: Secondary | ICD-10-CM

## 2013-02-24 DIAGNOSIS — N179 Acute kidney failure, unspecified: Secondary | ICD-10-CM

## 2013-03-10 ENCOUNTER — Non-Acute Institutional Stay (SKILLED_NURSING_FACILITY): Payer: Medicare Other | Admitting: Internal Medicine

## 2013-03-10 DIAGNOSIS — E119 Type 2 diabetes mellitus without complications: Secondary | ICD-10-CM

## 2013-03-10 DIAGNOSIS — J309 Allergic rhinitis, unspecified: Secondary | ICD-10-CM

## 2013-03-10 DIAGNOSIS — E7800 Pure hypercholesterolemia, unspecified: Secondary | ICD-10-CM

## 2013-03-10 DIAGNOSIS — M545 Low back pain: Secondary | ICD-10-CM

## 2013-03-10 NOTE — Progress Notes (Signed)
Patient ID: Bianca Wyatt, female   DOB: 08/10/1934, 79 y.o.   MRN: [id number]        PROGRESS NOTE  DATE:   02/24/2013  FACILITY:  [facility]   LEVEL OF CARE: SNF  Acute Visit  CHIEF COMPLAINT:  Manage acute renal failure, allergies and anemia of chronic kidney disease.    HISTORY OF PRESENT ILLNESS: I was requested by the staff to assess the patient regarding above problem(s):  CHRONIC KIDNEY DISEASE: The patient's chronic kidney disease is unstable.  Patient denies increasing lower extremity swelling or confusion. Last BUN and creatinine are:  BUN 33 and creatinine 1.50 on 02/20/2013.  In 08/2012:  BUN 34, creatinine 1.46.    ALLERGIES:  Patient is complaining of frequent nasal discharge.  Denies bleeding, wheezing or watery eyes.   Patient cannot identify precipitating or alleviating factors.  There is no temporal relationship.     ANEMIA: The anemia has been stable. The patient denies fatigue, melena or hematochezia. No complications from the medications currently being used.  On 02/20/2013:  Hemoglobin 9.8, MCV 85.  In 08/2012:  Hemoglobin 9.3.    PAST MEDICAL HISTORY : Reviewed.  No changes.  CURRENT MEDICATIONS: Reviewed per [facility]  REVIEW OF SYSTEMS:  GENERAL: no change in appetite, no fatigue, no weight changes, no fever, chills or weakness RESPIRATORY: no cough, SOB, DOE,, wheezing, hemoptysis CARDIAC: no chest pain, edema or palpitations GI: no abdominal pain, diarrhea, constipation, heart burn, nausea or vomiting  PHYSICAL EXAMINATION  GENERAL: no acute distress, morbidly obese body habitus EYES: conjunctivae normal, sclerae normal, normal eye lids NECK: supple, trachea midline, no neck masses, no thyroid tenderness, no thyromegaly LYMPHATICS: no LAN in the neck, no supraclavicular LAN RESPIRATORY: breathing is even & unlabored, BS CTAB CARDIAC: RRR, no murmur,no extra heart sounds EDEMA/VARICOSITIES:  +1 bilateral lower extremity edema  ARTERIAL:  pedal pulses  diminished  GI: abdomen soft, normal BS, no masses, no tenderness, no hepatomegaly, no splenomegaly PSYCHIATRIC: the patient is alert & oriented to person, affect & behavior appropriate  ASSESSMENT/PLAN:  Acute renal failure.  New problem.  Renal functions are worsening.  Reassess.    Allergic rhinitis.  New problem.  Start Claritin 10 mg q.d.   Anemia of chronic kidney disease.  Hemoglobin improved.  Continue iron.    CPT CODE: 40981

## 2013-03-20 NOTE — Progress Notes (Signed)
Patient ID: Bianca Wyatt, female   DOB: 03-31-1934, 79 y.o.   MRN: [id number]        PROGRESS NOTE  DATE:  03/10/2013  FACILITY: [facility]   LEVEL OF CARE: SNF  Routine Visit  CHIEF COMPLAINT:  Manage low back pain, diabetes mellitus and hyperlipidemia.    HISTORY OF PRESENT ILLNESS:  REASSESSMENT OF ONGOING PROBLEM(S):  LOW BACK PAIN:  New problem.  Patient is complaining of pain in her low back for some time.  She feels it started after her fall.  She denies radiation.  Symptoms are most of the time constant.  She cannot identify precipitating or alleviating factors.  There are no other associated signs and symptoms.  The pain is not relieved by Tylenol given.      DM:pt's DM remains stable.  Pt denies polyuria, polydipsia, polyphagia, changes in vision or hypoglycemic episodes.  No complications noted from the medication presently being used.  Last hemoglobin A1c is:  6.2 in 02/2013.    HYPERLIPIDEMIA: No complications from the medications presently being used. Last fasting lipid panel showed :  02/2013:  Fasting lipid panel normal.   PAST MEDICAL HISTORY : Reviewed.  No changes.  CURRENT MEDICATIONS: Reviewed per [facility]  REVIEW OF SYSTEMS:  GENERAL: no change in appetite, no fatigue, no weight changes, no fever, chills or weakness RESPIRATORY: no cough, SOB, DOE, wheezing, hemoptysis CARDIAC: no chest pain, edema or palpitations GI: no abdominal pain, diarrhea, constipation, heart burn, nausea or vomiting  PHYSICAL EXAMINATION  VS:  T 97.7      P  84    RR  18    BP  124/72   POX %     WT (Lb) 210  GENERAL: no acute distress, moderately obese body habitus EYES: conjunctivae normal, sclerae normal, normal eye lids NECK: supple, trachea midline, no neck masses, no thyroid tenderness, no thyromegaly LYMPHATICS: no LAN in the neck, no supraclavicular LAN RESPIRATORY: breathing is even & unlabored, BS CTAB CARDIAC: RRR, no murmur,no extra heart sounds, no edema GI: abdomen  soft, normal BS, no masses, no tenderness, no hepatomegaly, no splenomegaly PSYCHIATRIC: the patient is alert & oriented to person, affect & behavior appropriate MUSCULOSKELETAL:   BACK: there is tenderness to palpation in the right sacroiliac joint.  LABS/RADIOLOGY: 02/2013:  BUN 26, creatinine 1.36.   Hemoglobin 9.8, MCV 85, otherwise CBC normal.    Liver profile normal.    ASSESSMENT/PLAN:  Low back pain.  New problem.  Obtain x-ray of the right hip and LS-spine.  Discontinue Tylenol.  Start tramadol 50 mg t.i.d. for two weeks.    Diabetes mellitus with renal complications.  Well controlled.   Hyperlipidemia.  Well controlled.    Allergic rhinitis.  Claritin was started.    Renovascular hypertension.  Well controlled.     Anemia of chronic kidney disease.  Stable.  Continue iron.    Dementia.  Stable.   Constipation.  Well controlled.    CPT CODE: 91478

## 2013-03-31 ENCOUNTER — Non-Acute Institutional Stay (SKILLED_NURSING_FACILITY): Payer: Medicare Other | Admitting: Internal Medicine

## 2013-03-31 DIAGNOSIS — E7800 Pure hypercholesterolemia, unspecified: Secondary | ICD-10-CM

## 2013-03-31 DIAGNOSIS — J309 Allergic rhinitis, unspecified: Secondary | ICD-10-CM

## 2013-03-31 DIAGNOSIS — M25579 Pain in unspecified ankle and joints of unspecified foot: Secondary | ICD-10-CM

## 2013-03-31 DIAGNOSIS — M25571 Pain in right ankle and joints of right foot: Secondary | ICD-10-CM

## 2013-03-31 DIAGNOSIS — E1129 Type 2 diabetes mellitus with other diabetic kidney complication: Secondary | ICD-10-CM

## 2013-04-01 DIAGNOSIS — E1129 Type 2 diabetes mellitus with other diabetic kidney complication: Secondary | ICD-10-CM | POA: Insufficient documentation

## 2013-04-01 DIAGNOSIS — J309 Allergic rhinitis, unspecified: Secondary | ICD-10-CM | POA: Insufficient documentation

## 2013-04-01 DIAGNOSIS — M25579 Pain in unspecified ankle and joints of unspecified foot: Secondary | ICD-10-CM | POA: Insufficient documentation

## 2013-04-01 DIAGNOSIS — E7800 Pure hypercholesterolemia, unspecified: Secondary | ICD-10-CM | POA: Insufficient documentation

## 2013-04-01 NOTE — Progress Notes (Signed)
Patient ID: Bianca Wyatt, female   DOB: Oct 20, 1934, 79 y.o.   MRN: [id number]        PROGRESS NOTE  DATE:  03/31/2013  FACILITY: [facility]   LEVEL OF CARE: SNF  Routine Visit  CHIEF COMPLAINT:  Manage right ankle pain, diabetes mellitus and hyperlipidemia.    HISTORY OF PRESENT ILLNESS:  REASSESSMENT OF ONGOING PROBLEM(S):  RIGHT ANKLE PAIN:  New problem.  Patient is complaining of pain in her right ankle for few days.  She denies radiation.  Symptoms are most of the time constant.  She cannot identify precipitating or alleviating factors.  There are no other associated signs and symptoms.  She denies any injury to the ankle.  DM:pt's DM remains stable.  Pt denies polyuria, polydipsia, polyphagia, changes in vision or hypoglycemic episodes.  No complications noted from the medication presently being used.  Last hemoglobin A1c is:  6.2 in 02/2013.    HYPERLIPIDEMIA: No complications from the medications presently being used. Last fasting lipid panel showed :  02/2013:  Fasting lipid panel normal.   PAST MEDICAL HISTORY : Reviewed.  No changes.  CURRENT MEDICATIONS: Reviewed per [facility]  REVIEW OF SYSTEMS:  GENERAL: no change in appetite, no fatigue, no weight changes, no fever, chills or weakness RESPIRATORY: no cough, SOB, DOE, wheezing, hemoptysis CARDIAC: no chest pain, edema or palpitations GI: no abdominal pain, diarrhea, constipation, heart burn, nausea or vomiting  PHYSICAL EXAMINATION  VS:  T 98.6      P  88    RR  17    BP  107/83  POX %     WT (Lb) 212  GENERAL: no acute distress, moderately obese body habitus EYES: conjunctivae normal, sclerae normal, normal eye lids NECK: supple, trachea midline, no neck masses, no thyroid tenderness, no thyromegaly LYMPHATICS: no LAN in the neck, no supraclavicular LAN RESPIRATORY: breathing is even & unlabored, BS CTAB CARDIAC: RRR, no murmur,no extra heart sounds, no edema GI: abdomen soft, normal BS, no masses, no tenderness,  no hepatomegaly, no splenomegaly PSYCHIATRIC: the patient is alert & oriented to person, affect & behavior appropriate MUSCULOSKELETAL: Right ankle is exquisitely tender to palpation, range of motion testing exacerbates pain. No inflammatory changes noted.  LABS/RADIOLOGY: 02/2013:  BUN 26, creatinine 1.36.   Hemoglobin 9.8, MCV 85, otherwise CBC normal.    Liver profile normal.    ASSESSMENT/PLAN:  Right ankle pain.  New problem.  Obtain x-ray of the right ankle.  Start tramadol 50 mg t.i.d. for 10 days.  Diabetes mellitus with renal complications.  Well controlled.   Hyperlipidemia.  Well controlled.    Allergic rhinitis.  on Claritin.    Renovascular hypertension.  Well controlled.     Anemia of chronic kidney disease.  Stable.  Continue iron.    Dementia.  Stable.   Constipation.  Well controlled.    CPT CODE: 91478

## 2013-04-21 ENCOUNTER — Non-Acute Institutional Stay (SKILLED_NURSING_FACILITY): Payer: Medicare Other | Admitting: Internal Medicine

## 2013-04-21 DIAGNOSIS — I150 Renovascular hypertension: Secondary | ICD-10-CM | POA: Insufficient documentation

## 2013-04-21 DIAGNOSIS — E7800 Pure hypercholesterolemia, unspecified: Secondary | ICD-10-CM

## 2013-04-21 DIAGNOSIS — J309 Allergic rhinitis, unspecified: Secondary | ICD-10-CM

## 2013-04-21 DIAGNOSIS — E1129 Type 2 diabetes mellitus with other diabetic kidney complication: Secondary | ICD-10-CM

## 2013-04-21 NOTE — Progress Notes (Signed)
Patient ID: Bianca Wyatt, female   DOB: 1934/02/28, 79 y.o.   MRN: [id number]        PROGRESS NOTE  DATE:  04/21/2013  FACILITY: [facility]   LEVEL OF CARE: SNF  Routine Visit  CHIEF COMPLAINT:  Manage diabetes mellitus and hyperlipidemia.    HISTORY OF PRESENT ILLNESS:  REASSESSMENT OF ONGOING PROBLEM(S):  DM:pt's DM remains stable.  Pt denies polyuria, polydipsia, polyphagia, changes in vision or hypoglycemic episodes.  No complications noted from the medication presently being used.  Last hemoglobin A1c is:  6.2 in 02/2013.    HYPERLIPIDEMIA: No complications from the medications presently being used. Last fasting lipid panel showed :  02/2013:  Fasting lipid panel normal.   PAST MEDICAL HISTORY : Reviewed.  No changes.  CURRENT MEDICATIONS: Reviewed per [facility]  REVIEW OF SYSTEMS:  GENERAL: no change in appetite, no fatigue, no weight changes, no fever, chills or weakness RESPIRATORY: no cough, SOB, DOE, wheezing, hemoptysis CARDIAC: no chest pain, edema or palpitations GI: no abdominal pain, diarrhea, constipation, heart burn, nausea or vomiting  PHYSICAL EXAMINATION  VS:  T 98.5      P76    RR 20    BP  122/76  POX %     WT (Lb) 212  GENERAL: no acute distress, moderately obese body habitus NECK: supple, trachea midline, no neck masses, no thyroid tenderness, no thyromegaly RESPIRATORY: breathing is even & unlabored, BS CTAB CARDIAC: RRR, no murmur,no extra heart sounds, no edema GI: abdomen soft, normal BS, no masses, no tenderness, no hepatomegaly, no splenomegaly PSYCHIATRIC: the patient is alert & oriented to person, affect & behavior appropriate  LABS/RADIOLOGY: 02/2013:  BUN 26, creatinine 1.36.   Hemoglobin 9.8, MCV 85, otherwise CBC normal.    Liver profile normal.    ASSESSMENT/PLAN:  Diabetes mellitus with renal complications.  Well controlled.   Hyperlipidemia.  Well controlled.    Allergic rhinitis.  on Claritin.    Renovascular hypertension.   Well controlled.     Anemia of chronic kidney disease.  Stable.  Continue iron.    Dementia.  Stable.   Constipation.  Well controlled.    CPT CODE: 40981

## 2013-05-05 ENCOUNTER — Non-Acute Institutional Stay (SKILLED_NURSING_FACILITY): Payer: Medicare Other | Admitting: Internal Medicine

## 2013-05-05 DIAGNOSIS — R05 Cough: Secondary | ICD-10-CM

## 2013-05-26 DIAGNOSIS — R059 Cough, unspecified: Secondary | ICD-10-CM | POA: Insufficient documentation

## 2013-05-26 DIAGNOSIS — R05 Cough: Secondary | ICD-10-CM | POA: Insufficient documentation

## 2013-05-26 NOTE — Progress Notes (Signed)
Patient ID: Bianca Wyatt, female   DOB: Jan 12, 1934, 79 y.o.   MRN: [id number]        PROGRESS NOTE  DATE: 05/05/2013  FACILITY:  [facility]  LEVEL OF CARE: SNF (31)  Acute Visit  CHIEF COMPLAINT:  Manage cough.    HISTORY OF PRESENT ILLNESS: I was requested by the staff to assess the patient regarding above problem(s):  Patient is complaining of a nonproductive cough for a few days.  She denies shortness of breath, chest pain, fever, chills or night sweats.  She cannot identify precipitating or alleviating factors.  There is no temporal relationship.    PAST MEDICAL HISTORY : Reviewed.  No changes.  CURRENT MEDICATIONS: Reviewed per [facility]  PHYSICAL EXAMINATION  GENERAL: no acute distress, moderately obese body habitus NECK: supple, trachea midline, no neck masses, no thyroid tenderness, no thyromegaly LYMPHATICS: no LAN in the neck, no supraclavicular LAN RESPIRATORY: breathing is even & unlabored, BS CTAB  ASSESSMENT/PLAN:  Cough.  New problem.  Start Mucinex 600 mg b.i.d. for one week.    CPT CODE: 40981

## 2013-06-02 ENCOUNTER — Non-Acute Institutional Stay (SKILLED_NURSING_FACILITY): Payer: Medicare Other | Admitting: Internal Medicine

## 2013-06-02 DIAGNOSIS — J309 Allergic rhinitis, unspecified: Secondary | ICD-10-CM

## 2013-06-02 DIAGNOSIS — E1129 Type 2 diabetes mellitus with other diabetic kidney complication: Secondary | ICD-10-CM

## 2013-06-02 DIAGNOSIS — M545 Low back pain, unspecified: Secondary | ICD-10-CM

## 2013-06-02 DIAGNOSIS — E7800 Pure hypercholesterolemia, unspecified: Secondary | ICD-10-CM

## 2013-06-02 NOTE — Progress Notes (Signed)
Patient ID: Bianca Wyatt, female   DOB: Jul 11, 1934, 79 y.o.   MRN: [id number]        PROGRESS NOTE  DATE:  06/02/2013  FACILITY: [facility]   LEVEL OF CARE: SNF  Routine Visit  CHIEF COMPLAINT:  Manage low back pain, diabetes mellitus and hyperlipidemia.    HISTORY OF PRESENT ILLNESS:  REASSESSMENT OF ONGOING PROBLEM(S):  DM:pt's DM remains stable.  Pt denies polyuria, polydipsia, polyphagia, changes in vision or hypoglycemic episodes.  No complications noted from the medication presently being used.  Last hemoglobin A1c is:  6.2 in 02/2013.    HYPERLIPIDEMIA: No complications from the medications presently being used. Last fasting lipid panel showed :  02/2013:  Fasting lipid panel normal.   LOW BACK PAIN: Patient's low back pain is unstable.  Patient has uncontrolled back pain.  Denies stiffness, radiation, lower extremity weakness, or paresthesias.  No complications reported from the medication(s) presently being used. She is requesting bio freeze.  PAST MEDICAL HISTORY : Reviewed.  No changes.  CURRENT MEDICATIONS: Reviewed per [facility]  REVIEW OF SYSTEMS:  GENERAL: no change in appetite, no fatigue, no weight changes, no fever, chills or weakness RESPIRATORY: no cough, SOB, DOE, wheezing, hemoptysis CARDIAC: no chest pain, or palpitations, complains of lower extremity swelling. GI: no abdominal pain, diarrhea, constipation, heart burn, nausea or vomiting  PHYSICAL EXAMINATION  VS:  T 98.7      P92    RR 19    BP  106/86  POX %     WT (Lb) 211  GENERAL: no acute distress, moderately obese body habitus EYES: Normal conjunctivae, normal sclerae, no discharge NECK: supple, trachea midline, no neck masses, no thyroid tenderness, no thyromegaly LYMPHATICS: No cervical lymphadenopathy, no supraclavicular lymphadenopathy RESPIRATORY: breathing is even & unlabored, BS CTAB CARDIAC: RRR, no murmur,no extra heart sounds, no edema GI: abdomen soft, normal BS, no masses, no  tenderness, no hepatomegaly, no splenomegaly PSYCHIATRIC: the patient is alert & oriented to person, affect & behavior appropriate MUSCULOSKELETAL: Low back tender to palpation, pain exacerbated with range of motion testing  LABS/RADIOLOGY: 02/2013:  BUN 26, creatinine 1.36.   Hemoglobin 9.8, MCV 85, otherwise CBC normal.    Liver profile normal.    ASSESSMENT/PLAN:  Diabetes mellitus with renal complications.  Well controlled.   Hyperlipidemia.  Well controlled.   Low back pain-uncontrolled problem. Start bio freeze twice a day.   Allergic rhinitis.  on Claritin.    Renovascular hypertension.  Well controlled.     Anemia of chronic kidney disease.  Stable.  Continue iron.    Dementia.  Stable.   Constipation.  Well controlled.    CPT CODE: 69629

## 2013-06-11 ENCOUNTER — Other Ambulatory Visit: Payer: Self-pay | Admitting: Geriatric Medicine

## 2013-06-11 MED ORDER — HYDROCODONE-ACETAMINOPHEN 5-325 MG PO TABS: 1.0000 | ORAL_TABLET | ORAL | Status: DC | PRN

## 2013-07-07 ENCOUNTER — Non-Acute Institutional Stay (SKILLED_NURSING_FACILITY): Payer: Medicare Other | Admitting: Internal Medicine

## 2013-07-07 DIAGNOSIS — J309 Allergic rhinitis, unspecified: Secondary | ICD-10-CM

## 2013-07-07 DIAGNOSIS — E1129 Type 2 diabetes mellitus with other diabetic kidney complication: Secondary | ICD-10-CM

## 2013-07-07 DIAGNOSIS — E7800 Pure hypercholesterolemia, unspecified: Secondary | ICD-10-CM

## 2013-07-07 DIAGNOSIS — R3915 Urgency of urination: Secondary | ICD-10-CM

## 2013-07-12 DIAGNOSIS — R3915 Urgency of urination: Secondary | ICD-10-CM | POA: Insufficient documentation

## 2013-07-12 NOTE — Progress Notes (Signed)
Patient ID: Bianca Wyatt, female   DOB: 1934-09-18, 79 y.o.   MRN: [id number]        PROGRESS NOTE  DATE:  07/07/2013  FACILITY: [facility]   LEVEL OF CARE: SNF  Routine Visit  CHIEF COMPLAINT:  Manage urinary urgency, diabetes mellitus and hyperlipidemia.    HISTORY OF PRESENT ILLNESS:  REASSESSMENT OF ONGOING PROBLEM(S):  DM:pt's DM remains stable.  Pt denies polyuria, polydipsia, polyphagia, changes in vision or hypoglycemic episodes.  No complications noted from the medication presently being used.  Last hemoglobin A1c is:  6.2 in 02/2013, in 7-14 hemoglobin A1c 6.3  HYPERLIPIDEMIA: No complications from the medications presently being used. Last fasting lipid panel showed :  02/2013:  Fasting lipid panel normal.   URINARY URGENCY: New problem. Patient is complaining of new onset urinary urgency for a couple of days. She denies dysuria, hematuria, suprapubic pain or flank pain.  PAST MEDICAL HISTORY : Reviewed.  No changes.  CURRENT MEDICATIONS: Reviewed per [facility]  REVIEW OF SYSTEMS:  GENERAL: no change in appetite, no fatigue, no weight changes, no fever, chills or weakness RESPIRATORY: no cough, SOB, DOE, wheezing, hemoptysis CARDIAC: no chest pain, or palpitations, complains of lower extremity swelling. GI: no abdominal pain, diarrhea, constipation, heart burn, nausea or vomiting  PHYSICAL EXAMINATION  VS:  T 96.9      P 79    RR 18    BP  152/82  POX %     WT (Lb) 211  GENERAL: no acute distress, moderately obese body habitus EYES: Normal conjunctivae, normal sclerae, no discharge NECK: supple, trachea midline, no neck masses, no thyroid tenderness, no thyromegaly LYMPHATICS: No cervical lymphadenopathy, no supraclavicular lymphadenopathy RESPIRATORY: breathing is even & unlabored, BS CTAB CARDIAC: RRR, no murmur,no extra heart sounds, no edema GI: abdomen soft, normal BS, no masses, no tenderness, no hepatomegaly, no splenomegaly PSYCHIATRIC: the patient is alert  & oriented to person, affect & behavior appropriate MUSCULOSKELETAL: Low back tender to palpation, pain exacerbated with range of motion testing  LABS/RADIOLOGY: 02/2013:  BUN 26, creatinine 1.36, repeat BMP showed BUN 33, creatinine 1.5 otherwise CMP normal  Hemoglobin 9.8, MCV 85, otherwise CBC normal.    Liver profile normal.    ASSESSMENT/PLAN:  Diabetes mellitus with renal complications.  Well controlled.   Hyperlipidemia.  Well controlled.   Urinary urgency-new problem. Check UA culture and sensitivities.  Allergic rhinitis.  on Claritin.    Renovascular hypertension. Last blood pressure elevated. Will review a log    Anemia of chronic kidney disease.  Stable.  Continue iron.    Dementia.  Stable.   Constipation.  Well controlled.    CPT CODE: 40981

## 2013-08-04 ENCOUNTER — Non-Acute Institutional Stay (SKILLED_NURSING_FACILITY): Payer: Medicare Other | Admitting: Internal Medicine

## 2013-08-04 DIAGNOSIS — E7800 Pure hypercholesterolemia, unspecified: Secondary | ICD-10-CM

## 2013-08-04 DIAGNOSIS — I150 Renovascular hypertension: Secondary | ICD-10-CM

## 2013-08-04 DIAGNOSIS — E1129 Type 2 diabetes mellitus with other diabetic kidney complication: Secondary | ICD-10-CM

## 2013-08-04 DIAGNOSIS — K219 Gastro-esophageal reflux disease without esophagitis: Secondary | ICD-10-CM

## 2013-08-04 NOTE — Progress Notes (Signed)
Patient ID: Bianca Wyatt, female   DOB: 1934-01-29, 79 y.o.   MRN: [id number]        PROGRESS NOTE  DATE:  08/04/2013  FACILITY: [facility]   LEVEL OF CARE: SNF  Routine Visit  CHIEF COMPLAINT:  Manage GERD, diabetes mellitus and hyperlipidemia.    HISTORY OF PRESENT ILLNESS:  REASSESSMENT OF ONGOING PROBLEM(S):  GERD: New problem.  Complains of heartburn, denies abd. Pain, nausea or vomiting.  Currently not on a PPI.  DM:pt's DM remains stable.  Pt denies polyuria, polydipsia, polyphagia, changes in vision or hypoglycemic episodes.  No complications noted from the medication presently being used.  Last hemoglobin A1c is:  6.2 in 02/2013, in 7-14 hemoglobin A1c 6.3  HYPERLIPIDEMIA: No complications from the medications presently being used. Last fasting lipid panel showed :  02/2013:  Fasting lipid panel normal.   URINARY URGENCY: New problem. Patient is complaining of new onset urinary urgency for a couple of days. She denies dysuria, hematuria, suprapubic pain or flank pain.  PAST MEDICAL HISTORY : Reviewed.  No changes.  CURRENT MEDICATIONS: Reviewed per [facility]  REVIEW OF SYSTEMS:  GENERAL: no change in appetite, no fatigue, no weight changes, no fever, chills or weakness RESPIRATORY: no cough, SOB, DOE, wheezing, hemoptysis CARDIAC: no chest pain, or palpitations, complains of lower extremity swelling. GI: no abdominal pain, diarrhea, constipation, nausea or vomiting, complains of heartburn  PHYSICAL EXAMINATION  VS:  T 97.1      P 74   RR 18    BP  148/88  POX %     WT (Lb) 215  GENERAL: no acute distress, moderately obese body habitus EYES: Normal conjunctivae, normal sclerae, no discharge NECK: supple, trachea midline, no neck masses, no thyroid tenderness, no thyromegaly LYMPHATICS: No cervical lymphadenopathy, no supraclavicular lymphadenopathy RESPIRATORY: breathing is even & unlabored, BS CTAB CARDIAC: RRR, no murmur,no extra heart sounds, no edema GI: abdomen  soft, normal BS, no masses, no tenderness, no hepatomegaly, no splenomegaly PSYCHIATRIC: the patient is alert & oriented to person, affect & behavior appropriate  LABS/RADIOLOGY: 02/2013:  BUN 26, creatinine 1.36, repeat BMP showed BUN 33, creatinine 1.5 otherwise CMP normal  Hemoglobin 9.8, MCV 85, otherwise CBC normal.    Liver profile normal.    ASSESSMENT/PLAN:  Diabetes mellitus with renal complications.  Well controlled.  GERD-new problem. Start Prilosec 20 mg daily   Hyperlipidemia.  Well controlled.   Allergic rhinitis.  on Claritin.    Renovascular hypertension. Uncontrolled. Increase Norvasc to 10 mg daily  Anemia of chronic kidney disease.  Stable.  Continue iron.    Dementia.  Stable.   Constipation.  Well controlled.    CPT CODE: 40981

## 2013-08-25 ENCOUNTER — Non-Acute Institutional Stay (SKILLED_NURSING_FACILITY): Payer: Medicare Other | Admitting: Internal Medicine

## 2013-08-25 DIAGNOSIS — E1129 Type 2 diabetes mellitus with other diabetic kidney complication: Secondary | ICD-10-CM

## 2013-08-25 DIAGNOSIS — J309 Allergic rhinitis, unspecified: Secondary | ICD-10-CM

## 2013-08-25 DIAGNOSIS — E7800 Pure hypercholesterolemia, unspecified: Secondary | ICD-10-CM

## 2013-08-25 DIAGNOSIS — K219 Gastro-esophageal reflux disease without esophagitis: Secondary | ICD-10-CM

## 2013-08-30 NOTE — Progress Notes (Signed)
Patient ID: Bianca Wyatt, female   DOB: [birth year], 79 y.o.   MRN: [id number]        PROGRESS NOTE  DATE:  08/25/2013  [facility]   LEVEL OF CARE: SNF  Routine Visit  CHIEF COMPLAINT:  Manage GERD, diabetes mellitus and hyperlipidemia.    HISTORY OF PRESENT ILLNESS:  REASSESSMENT OF ONGOING PROBLEM(S):  GERD: unstable problem.  Complains of heartburn, denies abd. Pain, nausea or vomiting.  Currently on a PPI and tolerates it without any side effects.  DM:pt's DM remains stable.  Pt denies polyuria, polydipsia, polyphagia, changes in vision or hypoglycemic episodes.  No complications noted from the medication presently being used.  Last hemoglobin A1c is:  6.2 in 02/2013, in 7-14 hemoglobin A1c 6.3  HYPERLIPIDEMIA: No complications from the medications presently being used. Last fasting lipid panel showed :  02/2013:  Fasting lipid panel normal.   PAST MEDICAL HISTORY : Reviewed.  No changes.  CURRENT MEDICATIONS: Reviewed per [facility]  REVIEW OF SYSTEMS:  GENERAL: no change in appetite, no fatigue, no weight changes, no fever, chills or weakness RESPIRATORY: no SOB, DOE, wheezing, hemoptysis, complains of cough CARDIAC: no chest pain, or palpitations, complains of lower extremity swelling. GI: no abdominal pain, diarrhea, constipation, nausea or vomiting, complains of heartburn  PHYSICAL EXAMINATION  VS:  T 97.9      P 82   RR 18    BP  140/62  POX %     WT (Lb) 216  GENERAL: no acute distress, moderately obese body habitus EYES: Normal conjunctivae, normal sclerae, no discharge NECK: supple, trachea midline, no neck masses, no thyroid tenderness, no thyromegaly LYMPHATICS: No cervical lymphadenopathy, no supraclavicular lymphadenopathy RESPIRATORY: breathing is even & unlabored, BS CTAB CARDIAC: RRR, no murmur,no extra heart sounds, no edema GI: abdomen soft, normal BS, no masses, no tenderness, no hepatomegaly, no splenomegaly PSYCHIATRIC: the patient is alert &  oriented to person, affect & behavior appropriate  LABS/RADIOLOGY: 02/2013:  BUN 26, creatinine 1.36, repeat BMP showed BUN 33, creatinine 1.5 otherwise CMP normal  Hemoglobin 9.8, MCV 85, otherwise CBC normal.    Liver profile normal.    ASSESSMENT/PLAN:  Diabetes mellitus with renal complications.  Well controlled.  GERD-unstable problem. Increase Prilosec 20 mg bid   Hyperlipidemia.  Well controlled. Check fasting lipid panel  Allergic rhinitis.  on Claritin.    Renovascular hypertension. Blood pressure improved.  Norvasc increased to 10 mg daily  Anemia of chronic kidney disease.  Stable.  Continue iron.  Recheck.  Dementia.  Stable.   Constipation.  Well controlled.    Cough-new problem. Start Mucinex 600 mg twice a day.  Check CBC and CMP  CPT CODE: 40981

## 2013-09-01 ENCOUNTER — Non-Acute Institutional Stay (SKILLED_NURSING_FACILITY): Payer: Medicare Other | Admitting: Internal Medicine

## 2013-09-01 DIAGNOSIS — D631 Anemia in chronic kidney disease: Secondary | ICD-10-CM

## 2013-09-01 DIAGNOSIS — N189 Chronic kidney disease, unspecified: Secondary | ICD-10-CM

## 2013-09-01 DIAGNOSIS — E1129 Type 2 diabetes mellitus with other diabetic kidney complication: Secondary | ICD-10-CM

## 2013-09-09 ENCOUNTER — Non-Acute Institutional Stay (SKILLED_NURSING_FACILITY): Payer: Medicare Other | Admitting: Internal Medicine

## 2013-09-09 DIAGNOSIS — N189 Chronic kidney disease, unspecified: Secondary | ICD-10-CM

## 2013-09-09 DIAGNOSIS — R197 Diarrhea, unspecified: Secondary | ICD-10-CM

## 2013-09-23 ENCOUNTER — Non-Acute Institutional Stay (SKILLED_NURSING_FACILITY): Payer: Medicare Other | Admitting: Internal Medicine

## 2013-09-23 DIAGNOSIS — E7800 Pure hypercholesterolemia, unspecified: Secondary | ICD-10-CM

## 2013-09-23 DIAGNOSIS — K219 Gastro-esophageal reflux disease without esophagitis: Secondary | ICD-10-CM

## 2013-09-23 DIAGNOSIS — E1129 Type 2 diabetes mellitus with other diabetic kidney complication: Secondary | ICD-10-CM | POA: Insufficient documentation

## 2013-09-23 DIAGNOSIS — J309 Allergic rhinitis, unspecified: Secondary | ICD-10-CM

## 2013-09-23 NOTE — Progress Notes (Signed)
Patient ID: Bianca Wyatt, female   DOB: 04-28-1934, 79 y.o.   MRN: [id number]        PROGRESS NOTE  DATE:  09/23/2013  FACILITY: [facility]   LEVEL OF CARE: SNF  Routine Visit  CHIEF COMPLAINT:  Manage GERD, diabetes mellitus and hyperlipidemia.    HISTORY OF PRESENT ILLNESS:  REASSESSMENT OF ONGOING PROBLEM(S):  GERD:stable problem.  Denies of heartburn, denies abd. Pain, nausea or vomiting.  Currently on a PPI and tolerates it without any side effects.  DM:pt's DM remains stable.  Pt denies polyuria, polydipsia, polyphagia, changes in vision or hypoglycemic episodes.  No complications noted from the medication presently being used.  Last hemoglobin A1c is:  6.2 in 02/2013, in 7-14 hemoglobin A1c 6.3, in 10/14 hemoglobin A1c 7.1  HYPERLIPIDEMIA: No complications from the medications presently being used. Last fasting lipid panel showed :  02/2013:  Fasting lipid panel normal, in 10/14 HDL 35 otherwise fasting lipid panel normal   PAST MEDICAL HISTORY : Reviewed.  No changes.  CURRENT MEDICATIONS: Reviewed per [facility]  REVIEW OF SYSTEMS:  GENERAL: no change in appetite, no fatigue, no weight changes, no fever, chills or weakness RESPIRATORY: no SOB, DOE, wheezing, hemoptysis CARDIAC: no chest pain, swelling or palpitations,  GI: no abdominal pain, diarrhea, constipation, nausea or vomiting  PHYSICAL EXAMINATION  VS:  T 98      P 78   RR 20    BP  160/72  POX %     WT (Lb) 216  GENERAL: no acute distress, moderately obese body habitus EYES: Normal conjunctivae, normal sclerae, no discharge NECK: supple, trachea midline, no neck masses, no thyroid tenderness, no thyromegaly LYMPHATICS: No cervical lymphadenopathy, no supraclavicular lymphadenopathy RESPIRATORY: breathing is even & unlabored, BS CTAB CARDIAC: RRR, no murmur,no extra heart sounds, no edema GI: abdomen soft, normal BS, no masses, no tenderness, no hepatomegaly, no splenomegaly PSYCHIATRIC: the patient is alert &  oriented to person, affect & behavior appropriate  LABS/RADIOLOGY:  10-14 BUN 34, creatinine 1.85, hemoglobin 9.6, MCV 81 otherwise CBC normal, glucose 103, BUN 37, creatinine 1.85 otherwise CMP normal 02/2013:  BUN 26, creatinine 1.36, repeat BMP showed BUN 33, creatinine 1.5 otherwise CMP normal  Hemoglobin 9.8, MCV 85, otherwise CBC normal.    Liver profile normal.    ASSESSMENT/PLAN:   Diabetes mellitus with renal complications.  uncontrolled. Lantus was increased  GERD-denies symptoms  Hyperlipidemia.  Well controlled.   Allergic rhinitis.  on Claritin.    Renovascular hypertension. Blood pressure elevated. Norvasc was increased.  Anemia of chronic kidney disease.  Stable.  Continue iron.  Dementia.  Stable.   Constipation.  Well controlled.    CPT CODE: 78295

## 2013-09-25 DIAGNOSIS — D631 Anemia in chronic kidney disease: Secondary | ICD-10-CM | POA: Insufficient documentation

## 2013-09-25 DIAGNOSIS — N189 Chronic kidney disease, unspecified: Secondary | ICD-10-CM | POA: Insufficient documentation

## 2013-09-25 NOTE — Progress Notes (Signed)
Patient ID: Bianca Wyatt, female   DOB: 1934/04/18, 79 y.o.   MRN: [id number]        PROGRESS NOTE  DATE: 09/01/2013    FACILITY:  [facility]  LEVEL OF CARE: SNF (31)  Acute Visit  CHIEF COMPLAINT:  Manage chronic kidney disease, diabetes mellitus, and anemia of chronic kidney disease.    HISTORY OF PRESENT ILLNESS: I was requested by the staff to assess the patient regarding above problem(s):  DM:pt's DM is unstable.  Pt denies polyuria, polydipsia, polyphagia, changes in vision or hypoglycemic episodes.  No complications noted from the medication presently being used.  Last hemoglobin A1c is:  7.2 on 08/27/2013.    CHRONIC KIDNEY DISEASE: The patient's chronic kidney disease is unstable.  Patient denies increasing lower extremity swelling or confusion. Last BUN and creatinine are:   08/27/2013:  BUN 37, creatinine 1.85.  In 02/2013:  BUN 33, creatinine 1.5.   Patient is on  hydrochlorothiazide and lisinopril.    ANEMIA: The anemia is unstable. The patient denies fatigue, melena or hematochezia.  The patient is currently not on iron.   On 08/27/2013:  Hemoglobin 9.6, MCV 81.   In 02/2013:  Hemoglobin  9.8.    PAST MEDICAL HISTORY : Reviewed.  No changes.  CURRENT MEDICATIONS: Reviewed per [facility]  REVIEW OF SYSTEMS:  GENERAL: no change in appetite, no fatigue, no weight changes, no fever, chills or weakness RESPIRATORY: no cough, SOB, DOE,, wheezing, hemoptysis CARDIAC: no chest pain, edema or palpitations GI: no abdominal pain, diarrhea, constipation, heart burn, nausea or vomiting  PHYSICAL EXAMINATION  GENERAL: no acute distress, moderately obese body habitus EYES: conjunctivae normal, sclerae normal, normal eye lids NECK: supple, trachea midline, no neck masses, no thyroid tenderness, no thyromegaly LYMPHATICS: no LAN in the neck, no supraclavicular LAN RESPIRATORY: breathing is even & unlabored, BS CTAB CARDIAC: RRR, no murmur,no extra heart sounds    EDEMA/VARICOSITIES:  +1 bilateral edema    GI: abdomen soft, normal BS, no masses, no tenderness, no hepatomegaly, no splenomegaly PSYCHIATRIC: the patient is alert & oriented to person, affect & behavior appropriate  ASSESSMENT/PLAN:  Diabetes mellitus with renal complications.  Uncontrolled problem.  Increase Lantus to 11 U q.h.s.    Chronic kidney disease.  Unstable problem.  Renal functions are worsening.  Reassess.    Anemia of chronic kidney disease.  Unstable problem.  Hemoglobin declined.    CPT CODE: 40981

## 2013-10-03 DIAGNOSIS — R197 Diarrhea, unspecified: Secondary | ICD-10-CM | POA: Insufficient documentation

## 2013-10-03 NOTE — Progress Notes (Signed)
Patient ID: Bianca Wyatt, female   DOB: Nov 19, 1934, 79 y.o.   MRN: [id number]        PROGRESS NOTE  DATE: 09/09/2013  FACILITY:  [facility]  LEVEL OF CARE: SNF (31)  Acute Visit  CHIEF COMPLAINT:  Manage diarrhea and chronic kidney disease.    HISTORY OF PRESENT ILLNESS: I was requested by the staff to assess the patient regarding above problem(s):  DIARRHEA:  The patient is complaining of new onset diarrhea for a couple of days.  She stated she also had diarrhea last week.  She denies abdominal pain, nausea or vomiting.  She denies bloody stools.  She cannot identify precipitating or alleviating factors.  There is no temporal relationship.    CHRONIC KIDNEY DISEASE: The patient's chronic kidney disease remains stable.  Patient denies increasing lower extremity swelling or confusion. Last BUN and creatinine are:   On 09/05/2013:  BUN 34, creatinine 1.85.  On 08/27/2013:  BUN 37, creatinine 1.85.    PAST MEDICAL HISTORY : Reviewed.  No changes.  CURRENT MEDICATIONS: Reviewed per [facility]  REVIEW OF SYSTEMS:  GENERAL: no change in appetite, no fatigue, no weight changes, no fever, chills or weakness RESPIRATORY: no cough, SOB, DOE,, wheezing, hemoptysis CARDIAC: no chest pain, edema or palpitations GI: no abdominal pain, constipation, heart burn, nausea or vomiting; complains of diarrhea    PHYSICAL EXAMINATION  VS:  T 98.6       P 76      RR 18      BP 140/78     POX %       WT (Lb)  GENERAL: no acute distress, moderately obese body habitus NECK: supple, trachea midline, no neck masses, no thyroid tenderness, no thyromegaly RESPIRATORY: breathing is even & unlabored, BS CTAB CARDIAC: RRR, no murmur,no extra heart sounds, no edema GI: abdomen soft, normal BS, no masses, no tenderness, no hepatomegaly, no splenomegaly PSYCHIATRIC: the patient is alert & oriented to person, affect & behavior appropriate  ASSESSMENT/PLAN:  Diarrhea.  New problem.  We will check  stools for C.difficile toxin.  Use p.r.n. Lomotil.    Chronic kidney disease.  Renal functions stable.     CPT CODE: 57846

## 2013-10-21 ENCOUNTER — Non-Acute Institutional Stay (SKILLED_NURSING_FACILITY): Payer: Medicare Other | Admitting: Internal Medicine

## 2013-10-21 DIAGNOSIS — E1129 Type 2 diabetes mellitus with other diabetic kidney complication: Secondary | ICD-10-CM

## 2013-10-21 DIAGNOSIS — K219 Gastro-esophageal reflux disease without esophagitis: Secondary | ICD-10-CM

## 2013-10-21 DIAGNOSIS — E7800 Pure hypercholesterolemia, unspecified: Secondary | ICD-10-CM

## 2013-10-21 DIAGNOSIS — I150 Renovascular hypertension: Secondary | ICD-10-CM

## 2013-10-24 ENCOUNTER — Encounter: Payer: Self-pay | Admitting: Internal Medicine

## 2013-10-24 NOTE — Progress Notes (Signed)
Patient ID: Bianca Wyatt, female   DOB: 29-Jul-1934, 79 y.o.   MRN: [id number]        PROGRESS NOTE  DATE:  10/21/2013  FACILITY: [facility]   LEVEL OF CARE: SNF  Routine Visit  CHIEF COMPLAINT:  Manage GERD, diabetes mellitus and hyperlipidemia.    HISTORY OF PRESENT ILLNESS:  REASSESSMENT OF ONGOING PROBLEM(S):  GERD:stable problem.  Denies of heartburn, denies abd. Pain, nausea or vomiting.  Currently on a PPI and tolerates it without any side effects.  DM:pt's DM remains stable.  Pt denies polyuria, polydipsia, polyphagia, changes in vision or hypoglycemic episodes.  No complications noted from the medication presently being used.  Last hemoglobin A1c is:  6.2 in 02/2013, in 7-14 hemoglobin A1c 6.3, in 10/14 hemoglobin A1c 7.1  HYPERLIPIDEMIA: No complications from the medications presently being used. Last fasting lipid panel showed :  02/2013:  Fasting lipid panel normal, in 10/14 HDL 35 otherwise fasting lipid panel normal   PAST MEDICAL HISTORY : Reviewed.  No changes.  CURRENT MEDICATIONS: Reviewed per [facility]  REVIEW OF SYSTEMS:  GENERAL: no change in appetite, no fatigue, no weight changes, no fever, chills or weakness RESPIRATORY: no SOB, DOE, wheezing, hemoptysis CARDIAC: no chest pain, swelling or palpitations,  GI: no abdominal pain, diarrhea, constipation, nausea or vomiting  PHYSICAL EXAMINATION  VS:  T 98      P 78   RR 20    BP  160/72  POX %     WT (Lb) 216  GENERAL: no acute distress, moderately obese body habitus EYES: Normal conjunctivae, normal sclerae, no discharge NECK: supple, trachea midline, no neck masses, no thyroid tenderness, no thyromegaly LYMPHATICS: No cervical lymphadenopathy, no supraclavicular lymphadenopathy RESPIRATORY: breathing is even & unlabored, BS CTAB CARDIAC: RRR, no murmur,no extra heart sounds, no edema GI: abdomen soft, normal BS, no masses, no tenderness, no hepatomegaly, no splenomegaly PSYCHIATRIC: the patient is alert &  oriented to person, affect & behavior appropriate  LABS/RADIOLOGY:  10-14 BUN 34, creatinine 1.85, hemoglobin 9.6, MCV 81 otherwise CBC normal, glucose 103, BUN 37, creatinine 1.85 otherwise CMP normal 02/2013:  BUN 26, creatinine 1.36, repeat BMP showed BUN 33, creatinine 1.5 otherwise CMP normal  Hemoglobin 9.8, MCV 85, otherwise CBC normal.    Liver profile normal.    ASSESSMENT/PLAN:   Diabetes mellitus with renal complications.  uncontrolled. Lantus was increased  GERD-denies symptoms  Hyperlipidemia.  Well controlled.   Allergic rhinitis.  on Claritin.    Renovascular hypertension. . Discontinue HCTZ. Start Lasix 40 mg daily  Anemia of chronic kidney disease.  Stable.  Continue iron.  Dementia.  Stable.   Constipation.  Well controlled.    Check BMP in 3 days  CPT CODE: 56433

## 2013-11-25 ENCOUNTER — Non-Acute Institutional Stay (SKILLED_NURSING_FACILITY): Payer: Medicare Other | Admitting: Internal Medicine

## 2013-11-25 DIAGNOSIS — J309 Allergic rhinitis, unspecified: Secondary | ICD-10-CM

## 2013-11-25 DIAGNOSIS — E7800 Pure hypercholesterolemia, unspecified: Secondary | ICD-10-CM

## 2013-11-25 DIAGNOSIS — E1129 Type 2 diabetes mellitus with other diabetic kidney complication: Secondary | ICD-10-CM

## 2013-11-25 DIAGNOSIS — E1165 Type 2 diabetes mellitus with hyperglycemia: Principal | ICD-10-CM

## 2013-11-25 DIAGNOSIS — K219 Gastro-esophageal reflux disease without esophagitis: Secondary | ICD-10-CM

## 2013-11-25 NOTE — Progress Notes (Signed)
Patient ID: Bianca Wyatt, female   DOB: 11/07/1934, 80 y.o.   MRN: [id number]        PROGRESS NOTE  DATE: 11-25-13  FACILITY: [facility]   LEVEL OF CARE: SNF  Routine Visit  CHIEF COMPLAINT:  Manage GERD, diabetes mellitus and hyperlipidemia.    HISTORY OF PRESENT ILLNESS:  REASSESSMENT OF ONGOING PROBLEM(S):  GERD:stable problem.  Denies of heartburn, denies abd. Pain, nausea or vomiting.  Currently on a PPI and tolerates it without any side effects.  DM:pt's DM remains stable.  Pt denies polyuria, polydipsia, polyphagia, changes in vision or hypoglycemic episodes.  No complications noted from the medication presently being used.  Last hemoglobin A1c is:  6.2 in 02/2013, in 7-14 hemoglobin A1c 6.3, in 10/14 hemoglobin A1c 7.1  HYPERLIPIDEMIA: No complications from the medications presently being used. Last fasting lipid panel showed :  02/2013:  Fasting lipid panel normal, in 10/14 HDL 35 otherwise fasting lipid panel normal   PAST MEDICAL HISTORY : Reviewed.  No changes.  CURRENT MEDICATIONS: [facility]  REVIEW OF SYSTEMS:  GENERAL: no change in appetite, no fatigue, no weight changes, no fever, chills or weakness RESPIRATORY: no SOB, DOE, wheezing, hemoptysis CARDIAC: no chest pain, swelling or palpitations,  GI: no abdominal pain, diarrhea, constipation, nausea or vomiting  PHYSICAL EXAMINATION  VS:  T 97.8      P 75   RR 22    BP 149/88     WT (Lb) 218  GENERAL: no acute distress, moderately obese body habitus NECK: supple, trachea midline, no neck masses, no thyroid tenderness, no thyromegaly RESPIRATORY: breathing is even & unlabored, BS CTAB CARDIAC: RRR, no murmur,no extra heart sounds, no edema GI: abdomen soft, normal BS, no masses, no tenderness, no hepatomegaly, no splenomegaly PSYCHIATRIC: the patient is alert & oriented to person, affect & behavior appropriate  LABS/RADIOLOGY:  12 -14 BUN 35, creatinine 1.8, chloride 1:15 otherwise BMP  normal  10-14 BUN 34, creatinine 1.85, hemoglobin 9.6, MCV 81 otherwise CBC normal, glucose 103, BUN 37, creatinine 1.85 otherwise CMP normal 02/2013:  BUN 26, creatinine 1.36, repeat BMP showed BUN 33, creatinine 1.5 otherwise CMP normal  Hemoglobin 9.8, MCV 85, otherwise CBC normal.    Liver profile normal.    ASSESSMENT/PLAN:   Diabetes mellitus with renal complications.  uncontrolled. Lantus was increased  GERD-denies symptoms  Hyperlipidemia.  Well controlled.   Allergic rhinitis.  on Claritin.    Renovascular hypertension. Blood pressure improved  Anemia of chronic kidney disease.  Stable.  Continue iron.  Dementia.  Stable.   Constipation.  Well controlled.    CPT CODE: [id number]

## 2014-03-03 ENCOUNTER — Non-Acute Institutional Stay (SKILLED_NURSING_FACILITY): Payer: Medicare Other | Admitting: Adult Health

## 2014-03-03 DIAGNOSIS — F32A Depression, unspecified: Secondary | ICD-10-CM

## 2014-03-03 DIAGNOSIS — E1165 Type 2 diabetes mellitus with hyperglycemia: Secondary | ICD-10-CM

## 2014-03-03 DIAGNOSIS — E1129 Type 2 diabetes mellitus with other diabetic kidney complication: Secondary | ICD-10-CM

## 2014-03-03 DIAGNOSIS — R609 Edema, unspecified: Secondary | ICD-10-CM

## 2014-03-03 DIAGNOSIS — F329 Major depressive disorder, single episode, unspecified: Secondary | ICD-10-CM

## 2014-03-03 DIAGNOSIS — N039 Chronic nephritic syndrome with unspecified morphologic changes: Secondary | ICD-10-CM

## 2014-03-03 DIAGNOSIS — I150 Renovascular hypertension: Secondary | ICD-10-CM

## 2014-03-03 DIAGNOSIS — M1990 Unspecified osteoarthritis, unspecified site: Secondary | ICD-10-CM

## 2014-03-03 DIAGNOSIS — F0390 Unspecified dementia without behavioral disturbance: Secondary | ICD-10-CM

## 2014-03-03 DIAGNOSIS — E7800 Pure hypercholesterolemia, unspecified: Secondary | ICD-10-CM

## 2014-03-03 DIAGNOSIS — D631 Anemia in chronic kidney disease: Secondary | ICD-10-CM

## 2014-03-03 DIAGNOSIS — I2699 Other pulmonary embolism without acute cor pulmonale: Secondary | ICD-10-CM

## 2014-03-03 DIAGNOSIS — R3915 Urgency of urination: Secondary | ICD-10-CM

## 2014-03-03 DIAGNOSIS — F3289 Other specified depressive episodes: Secondary | ICD-10-CM

## 2014-03-03 DIAGNOSIS — K219 Gastro-esophageal reflux disease without esophagitis: Secondary | ICD-10-CM

## 2014-03-16 ENCOUNTER — Encounter: Payer: Self-pay | Admitting: Adult Health

## 2014-03-16 DIAGNOSIS — R609 Edema, unspecified: Secondary | ICD-10-CM | POA: Insufficient documentation

## 2014-03-16 DIAGNOSIS — M1990 Unspecified osteoarthritis, unspecified site: Secondary | ICD-10-CM | POA: Insufficient documentation

## 2014-03-16 DIAGNOSIS — I2699 Other pulmonary embolism without acute cor pulmonale: Secondary | ICD-10-CM | POA: Insufficient documentation

## 2014-03-16 DIAGNOSIS — F0390 Unspecified dementia without behavioral disturbance: Secondary | ICD-10-CM | POA: Insufficient documentation

## 2014-03-16 DIAGNOSIS — F32A Depression, unspecified: Secondary | ICD-10-CM | POA: Insufficient documentation

## 2014-03-16 DIAGNOSIS — F329 Major depressive disorder, single episode, unspecified: Secondary | ICD-10-CM | POA: Insufficient documentation

## 2014-03-16 NOTE — Progress Notes (Signed)
Patient ID: Bianca Wyatt, female   DOB: 01/03/1934, 80 y.o.   MRN: [id number]     [facility]  No Known Allergies   Chief Complaint  Patient presents with  . Medical Management of Chronic Issues    HPI:  She is being seen for the management of her chronic illnesses. Overall there is little change in her status. She is on chronic coumadin therapy for PE; which is being managed by the pharmacy. She is not voicing any complaints. The nursing staff is not voicing any concerns.    Past Medical History  Diagnosis Date  . Diabetes mellitus without complication   . Hypertension   . Anemia   . Allergy   . Arthritis   . Seizures   . Syncope     Past Surgical History  Procedure Laterality Date  . Exploratory lap for sbo  2012    VITAL SIGNS BP 118/63  Pulse 70  Ht 5\' 2"  (1.575 m)  Wt 209 lb (94.802 kg)  BMI 38.22 kg/m2   Patient's Medications  New Prescriptions   No medications on file  Previous Medications   AMLODIPINE (NORVASC) 10 MG TABLET    Take 10 mg by mouth daily.   ATORVASTATIN (LIPITOR) 10 MG TABLET    Take 10 mg by mouth daily at 6 PM.   DULOXETINE (CYMBALTA) 20 MG CAPSULE    Take 40 mg by mouth daily.   FUROSEMIDE (LASIX) 40 MG TABLET    Take 40 mg by mouth daily.   HYDROCODONE-ACETAMINOPHEN (NORCO/VICODIN) 5-325 MG PER TABLET    Take 1 tablet by mouth every 4 (four) hours as needed for pain.   INSULIN ASPART (NOVOLOG) 100 UNIT/ML INJECTION    Inject 0-10 Units into the skin 3 (three) times daily before meals. SSI   INSULIN GLARGINE (LANTUS) 100 UNIT/ML INJECTION    Inject 11 Units into the skin at bedtime.   IRON POLYSACCHARIDES (NIFEREX) 150 MG CAPSULE    Take 150 mg by mouth 2 (two) times daily.   LACTULOSE (CHRONULAC) 10 GM/15ML SOLUTION    Take 10 g by mouth 2 (two) times daily.   LISINOPRIL (PRINIVIL,ZESTRIL) 40 MG TABLET    Take 40 mg by mouth daily.   LORATADINE (CLARITIN) 10 MG TABLET    Take 10 mg by mouth daily.   MENTHOL, TOPICAL ANALGESIC,  (BIOFREEZE) 4 % GEL    Apply 1 application topically 2 (two) times daily. To back and knees   OMEPRAZOLE (PRILOSEC) 20 MG CAPSULE    Take 20 mg by mouth 2 (two) times daily before a meal.   OXYBUTYNIN (DITROPAN-XL) 10 MG 24 HR TABLET    Take 10 mg by mouth at bedtime.   RIVASTIGMINE (EXELON) 6 MG CAPSULE    Take 6 mg by mouth 2 (two) times daily.   VITAMIN D, ERGOCALCIFEROL, (DRISDOL) 50000 UNITS CAPS CAPSULE    Take 50,000 Units by mouth every 7 (seven) days.   WARFARIN (COUMADIN) 4 MG TABLET    Take 4 mg by mouth daily at 6 PM.  Modified Medications   No medications on file  Discontinued Medications   No medications on file    SIGNIFICANT DIAGNOSTIC EXAMS   LABS REVIEWED:   08-27-13: wbc 7.6; hgb 9.6; hct 29.4; mcv 81;plt 328; glucose 103; bun 37; creat 1.85; k+4.6; na++146 Liver normal albumin 3.8 11-07-13: glucose 82; bun 35; creat 1.88; k+4.6; na++146 12-03-13: hgb a1c 6.7 02-03-14: glucose 84; bun 35; creat 2.0; k+4.4; na++141  Review of Systems  Constitutional: Negative for malaise/fatigue.  Respiratory: Negative for cough.   Cardiovascular: Negative for chest pain.  Gastrointestinal: Negative for heartburn and abdominal pain.  Musculoskeletal: Negative for joint pain and myalgias.  Skin: Negative.   Psychiatric/Behavioral: The patient is not nervous/anxious.     Physical Exam  Constitutional: She appears well-developed and well-nourished. No distress.  overweight  Neck: Neck supple. No JVD present.  Cardiovascular: Normal rate, regular rhythm and intact distal pulses.   Respiratory: Effort normal and breath sounds normal. No respiratory distress. She has no wheezes.  GI: Soft. Bowel sounds are normal. She exhibits no distension. There is no tenderness.  Musculoskeletal: She exhibits no edema.  Is able to move extremities.   Neurological: She is alert.  Skin: Skin is warm and dry. She is not diaphoretic.  Psychiatric: She has a normal mood and affect.       ASSESSMENT/ PLAN:  1. Hypertension; is stable will continue lisinopril 40 mg daily; and norvasc 10 mg daily and will monitor her status   2. Anemia: will continue ferrex 150 mg twice daily  3. Dyslipidemia: will continue lipitor 10 mg daily   4. Depression: is emotionally stable will continue cymbalta 40 mg daily   5. Osteoarthritis: is stable will continue biofreze to knees and back twice daily will continue cymbalta 40 mg daily and vicodin 5/325 mg every 4 hours as needed   6. Genella RifeGerd: will continue prilosec 20 mg twice daily   7. Edema: is stable will continue lasix 40 mg daily   8. Constipation: will continue lactulose 15 cc twice daily   9. Diabetes: will continue lantus 11 units daily and novolog SSI  10. Pulmonary embolism: no signs of respiratory distress present is on long term coumadin therapy; will continue 4 mg daily this is managed by pharmacy  11. Allergic rhinitis: will continue claritin 10 mg daily   12: urinary urgency: will continue ditropan xl 10 mg daily   13: dementia: no change in status; will continue exelon 6 mg twice daily    Will check lipids and vit d next draw  Time spent with patient 50 minutes    Synthia Innocenteborah Leelynn Whetsel NP [facility]  Contact [phone] Monday through Friday 8am- 5pm  After hours call [phone]

## 2014-03-30 ENCOUNTER — Non-Acute Institutional Stay (SKILLED_NURSING_FACILITY): Payer: Medicare Other | Admitting: Internal Medicine

## 2014-03-30 DIAGNOSIS — E7800 Pure hypercholesterolemia, unspecified: Secondary | ICD-10-CM

## 2014-03-30 DIAGNOSIS — J309 Allergic rhinitis, unspecified: Secondary | ICD-10-CM

## 2014-03-30 DIAGNOSIS — K219 Gastro-esophageal reflux disease without esophagitis: Secondary | ICD-10-CM

## 2014-03-30 DIAGNOSIS — E119 Type 2 diabetes mellitus without complications: Secondary | ICD-10-CM

## 2014-03-30 NOTE — Progress Notes (Signed)
Patient ID: Bianca Wyatt, female   DOB: 08/23/1934, 80 y.o.   MRN: [id number]         PROGRESS NOTE  DATE: 03-30-14  FACILITY: [facility]   LEVEL OF CARE: SNF  Routine Visit  CHIEF COMPLAINT:  Manage GERD, diabetes mellitus and hyperlipidemia.    HISTORY OF PRESENT ILLNESS:  REASSESSMENT OF ONGOING PROBLEM(S):  GERD:stable problem.  Denies of heartburn, denies abd. Pain, nausea or vomiting.  Currently on a PPI and tolerates it without any side effects.  DM:pt's DM remains stable.  Pt denies polyuria, polydipsia, polyphagia, changes in vision or hypoglycemic episodes.  No complications noted from the medication presently being used.  Last hemoglobin A1c is:  6.2 in 02/2013, in 7-14 hemoglobin A1c 6.3, in 10/14 hemoglobin A1c 7.1, in 4-15 hemoglobin A1c 6.6  HYPERLIPIDEMIA: No complications from the medications presently being used. Last fasting lipid panel showed :  02/2013:  Fasting lipid panel normal, in 10/14 HDL 35 otherwise fasting lipid panel normal, in 4-15 triglycerides 163, HDL 33, LDL 101, total cholesterol 167.  PAST MEDICAL HISTORY : Reviewed.  No changes.  [facility]  REVIEW OF SYSTEMS:  GENERAL: no change in appetite, no fatigue, no weight changes, no fever, chills or weakness RESPIRATORY: no SOB, DOE, wheezing, hemoptysis CARDIAC: no chest pain, swelling or palpitations,  GI: no abdominal pain, diarrhea, constipation, nausea or vomiting  PHYSICAL EXAMINATION  VS: See vital signs section  GENERAL: no acute distress, moderately obese body habitus EYES: Normal sclerae, normal conjunctivae, no discharge LYMPHATICS: No cervical lymphadenopathy, no supraclavicular lymphadenopathy NECK: supple, trachea midline, no neck masses, no thyroid tenderness, no thyromegaly RESPIRATORY: breathing is even & unlabored, BS CTAB CARDIAC: RRR, no murmur,no extra heart sounds, +1 bilateral lower extremity edema GI: abdomen soft, normal BS, no masses, no  tenderness, no hepatomegaly, no splenomegaly PSYCHIATRIC: the patient is alert & oriented to person, affect & behavior appropriate  LABS/RADIOLOGY: 4-15 hemoglobin 10.1, MCV 84 otherwise CBC normal, BUN 40, creatinine 1.78 otherwise CMP normal, vitamin D level 24.9 12 -14 BUN 35, creatinine 1.8, chloride 1:15 otherwise BMP normal  10-14 BUN 34, creatinine 1.85, hemoglobin 9.6, MCV 81 otherwise CBC normal, glucose 103, BUN 37, creatinine 1.85 otherwise CMP normal 02/2013:  BUN 26, creatinine 1.36, repeat BMP showed BUN 33, creatinine 1.5 otherwise CMP normal  Hemoglobin 9.8, MCV 85, otherwise CBC normal.    Liver profile normal.    ASSESSMENT/PLAN:   Diabetes mellitus with renal complications.  Well controlled  GERD-denies symptoms  Hyperlipidemia.  Well controlled.   Allergic rhinitis.  on Claritin.    Renovascular hypertension. Blood pressure improved  Anemia of chronic kidney disease.  Stable.  Continue iron.  Dementia.  Stable.   Constipation.  Well controlled.    Vitamin D deficiency-on supplementation  CPT CODE: [id number]  Newton PiggGayani Y. Kerry Doryasanayaka, MD [facility] [phone]

## 2014-04-22 ENCOUNTER — Non-Acute Institutional Stay (SKILLED_NURSING_FACILITY): Payer: Medicare Other | Admitting: Internal Medicine

## 2014-04-22 DIAGNOSIS — E119 Type 2 diabetes mellitus without complications: Secondary | ICD-10-CM

## 2014-04-22 DIAGNOSIS — K219 Gastro-esophageal reflux disease without esophagitis: Secondary | ICD-10-CM

## 2014-04-22 DIAGNOSIS — J309 Allergic rhinitis, unspecified: Secondary | ICD-10-CM

## 2014-04-22 DIAGNOSIS — E7800 Pure hypercholesterolemia, unspecified: Secondary | ICD-10-CM

## 2014-04-22 NOTE — Progress Notes (Signed)
Patient ID: Bianca Wyatt, female   DOB: 17-Nov-1934, 80 y.o.   MRN: [id number]          PROGRESS NOTE  DATE: 04-22-14  FACILITY: [facility]   LEVEL OF CARE: SNF  Routine Visit  CHIEF COMPLAINT:  Manage GERD, diabetes mellitus and hyperlipidemia.    HISTORY OF PRESENT ILLNESS:  REASSESSMENT OF ONGOING PROBLEM(S):  GERD:stable problem.  Denies of heartburn, denies abd. Pain, nausea or vomiting.  Currently on a PPI and tolerates it without any side effects.  DM:pt's DM remains stable.  Pt denies polyuria, polydipsia, polyphagia, changes in vision or hypoglycemic episodes.  No complications noted from the medication presently being used.  Last hemoglobin A1c is:  6.2 in 02/2013, in 7-14 hemoglobin A1c 6.3, in 10/14 hemoglobin A1c 7.1, in 4-15 hemoglobin A1c 6.6  HYPERLIPIDEMIA: No complications from the medications presently being used. Last fasting lipid panel showed :  02/2013:  Fasting lipid panel normal, in 10/14 HDL 35 otherwise fasting lipid panel normal, in 4-15 triglycerides 163, HDL 33, LDL 101, total cholesterol 167.  PAST MEDICAL HISTORY : Reviewed.  No changes.  CURRENT MEDICATIONS: Reviewed per [facility]  REVIEW OF SYSTEMS:  GENERAL: no change in appetite, no fatigue, no weight changes, no fever, chills or weakness RESPIRATORY: no SOB, DOE, wheezing, hemoptysis CARDIAC: no chest pain, swelling or palpitations,  GI: no abdominal pain, diarrhea, constipation, nausea or vomiting  PHYSICAL EXAMINATION  VS: See vital signs section  GENERAL: no acute distress, moderately obese body habitus NECK: supple, trachea midline, no neck masses, no thyroid tenderness, no thyromegaly RESPIRATORY: breathing is even & unlabored, BS CTAB CARDIAC: RRR, no murmur,no extra heart sounds, +1 bilateral lower extremity edema GI: abdomen soft, normal BS, no masses, no tenderness, no hepatomegaly, no splenomegaly PSYCHIATRIC: the patient is alert & oriented to person, affect & behavior  appropriate  LABS/RADIOLOGY: 4-15 hemoglobin 10.1, MCV 84 otherwise CBC normal, BUN 40, creatinine 1.78 otherwise CMP normal, vitamin D level 24.9 12 -14 BUN 35, creatinine 1.8, chloride 1:15 otherwise BMP normal  10-14 BUN 34, creatinine 1.85, hemoglobin 9.6, MCV 81 otherwise CBC normal, glucose 103, BUN 37, creatinine 1.85 otherwise CMP normal 02/2013:  BUN 26, creatinine 1.36, repeat BMP showed BUN 33, creatinine 1.5 otherwise CMP normal  Hemoglobin 9.8, MCV 85, otherwise CBC normal.    Liver profile normal.    ASSESSMENT/PLAN:   Diabetes mellitus with renal complications.  Well controlled  GERD-denies symptoms  Hyperlipidemia.  Well controlled.   Allergic rhinitis.  on Claritin.    Renovascular hypertension. Blood pressure improved  Anemia of chronic kidney disease.  Stable.  Continue iron.  Dementia.  Stable.   Constipation.  Well controlled.    Vitamin D deficiency-on supplementation  CPT CODE: 13244  Newton Pigg. Kerry Dory, MD [facility] [phone]

## 2014-05-28 ENCOUNTER — Non-Acute Institutional Stay (SKILLED_NURSING_FACILITY): Payer: Medicare Other | Admitting: Internal Medicine

## 2014-05-28 DIAGNOSIS — E7800 Pure hypercholesterolemia, unspecified: Secondary | ICD-10-CM

## 2014-05-28 DIAGNOSIS — J309 Allergic rhinitis, unspecified: Secondary | ICD-10-CM

## 2014-05-28 DIAGNOSIS — E1129 Type 2 diabetes mellitus with other diabetic kidney complication: Secondary | ICD-10-CM

## 2014-05-28 DIAGNOSIS — K219 Gastro-esophageal reflux disease without esophagitis: Secondary | ICD-10-CM

## 2014-05-28 NOTE — Progress Notes (Signed)
Patient ID: Bianca Wyatt, female   DOB: 11/01/1934, 80 y.o.   MRN: [id number]         PROGRESS NOTE  DATE: 05-28-14  FACILITY: [facility]   LEVEL OF CARE: SNF  Routine Visit  CHIEF COMPLAINT:  Manage GERD, diabetes mellitus and hyperlipidemia.    HISTORY OF PRESENT ILLNESS:  REASSESSMENT OF ONGOING PROBLEM(S):  GERD:stable problem.  Denies of heartburn, denies abd. Pain, nausea or vomiting.  Currently on a PPI and tolerates it without any side effects.  DM:pt's DM remains stable.  Pt denies polyuria, polydipsia, polyphagia, changes in vision or hypoglycemic episodes.  No complications noted from the medication presently being used.  Last hemoglobin A1c is:  6.2 in 02/2013, in 7-14 hemoglobin A1c 6.3, in 10/14 hemoglobin A1c 7.1, in 4-15 hemoglobin A1c 6.6  HYPERLIPIDEMIA: No complications from the medications presently being used. Last fasting lipid panel showed :  02/2013:  Fasting lipid panel normal, in 10/14 HDL 35 otherwise fasting lipid panel normal, in 4-15 triglycerides 163, HDL 33, LDL 101, total cholesterol 167.  PAST MEDICAL HISTORY : Reviewed.  No changes.  [facility]  REVIEW OF SYSTEMS:  GENERAL: no change in appetite, no fatigue, no weight changes, no fever, chills or weakness RESPIRATORY: no SOB, DOE, wheezing, hemoptysis CARDIAC: no chest pain, swelling or palpitations,  GI: no abdominal pain, diarrhea, constipation, nausea or vomiting  PHYSICAL EXAMINATION  VS: See vital signs section  GENERAL: no acute distress, moderately obese body habitus NECK: supple, trachea midline, no neck masses, no thyroid tenderness, no thyromegaly RESPIRATORY: breathing is even & unlabored, BS CTAB CARDIAC: RRR, no murmur,no extra heart sounds, +1 bilateral lower extremity edema GI: abdomen soft, normal BS, no masses, no tenderness, no hepatomegaly, no splenomegaly PSYCHIATRIC: the patient is alert & oriented to person, affect & behavior  appropriate  LABS/RADIOLOGY: 4-15 hemoglobin 10.1, MCV 84 otherwise CBC normal, BUN 40, creatinine 1.78 otherwise CMP normal, vitamin D level 24.9 12 -14 BUN 35, creatinine 1.8, chloride 1:15 otherwise BMP normal  10-14 BUN 34, creatinine 1.85, hemoglobin 9.6, MCV 81 otherwise CBC normal, glucose 103, BUN 37, creatinine 1.85 otherwise CMP normal 02/2013:  BUN 26, creatinine 1.36, repeat BMP showed BUN 33, creatinine 1.5 otherwise CMP normal  Hemoglobin 9.8, MCV 85, otherwise CBC normal.    Liver profile normal.    ASSESSMENT/PLAN:   Diabetes mellitus with renal complications.  Well controlled  GERD-denies symptoms  Hyperlipidemia.  Well controlled.   Allergic rhinitis.  on Claritin.    Renovascular hypertension. Blood pressure borderline.  Anemia of chronic kidney disease.  Stable.  Continue iron.  Dementia.  Stable.   Constipation.  Well controlled.    Vitamin D deficiency-on supplementation  CPT CODE: 1324499308  Bianca PiggGayani Y. Kerry Doryasanayaka, MD [facility] [phone]

## 2014-06-29 ENCOUNTER — Non-Acute Institutional Stay (SKILLED_NURSING_FACILITY): Payer: Medicare Other | Admitting: Internal Medicine

## 2014-06-29 DIAGNOSIS — E7800 Pure hypercholesterolemia, unspecified: Secondary | ICD-10-CM

## 2014-06-29 DIAGNOSIS — I150 Renovascular hypertension: Secondary | ICD-10-CM

## 2014-06-29 DIAGNOSIS — K219 Gastro-esophageal reflux disease without esophagitis: Secondary | ICD-10-CM

## 2014-06-29 DIAGNOSIS — E1129 Type 2 diabetes mellitus with other diabetic kidney complication: Secondary | ICD-10-CM

## 2014-06-30 NOTE — Progress Notes (Signed)
Patient ID: Bianca HoppingJoylene Anstey, female   DOB: 01/23/1934, 80 y.o.   MRN: [id number]         PROGRESS NOTE  DATE: 06-29-14  FACILITY: [facility]   LEVEL OF CARE: SNF  Routine Visit  CHIEF COMPLAINT:  Manage GERD, diabetes mellitus and hyperlipidemia.    HISTORY OF PRESENT ILLNESS:  REASSESSMENT OF ONGOING PROBLEM(S):  GERD:stable problem.  Denies of heartburn, denies abd. Pain, nausea or vomiting.  Currently on a PPI and tolerates it without any side effects.  DM:pt's DM remains stable.  Pt denies polyuria, polydipsia, polyphagia, changes in vision or hypoglycemic episodes.  No complications noted from the medication presently being used.  Last hemoglobin A1c is:  6.2 in 02/2013, in 7-14 hemoglobin A1c 6.3, in 10/14 hemoglobin A1c 7.1, in 4-15 hemoglobin A1c 6.6, in 7-15 hemoglobin A1c 6.4  HYPERLIPIDEMIA: No complications from the medications presently being used. Last fasting lipid panel showed :  02/2013:  Fasting lipid panel normal, in 10/14 HDL 35 otherwise fasting lipid panel normal, in 4-15 triglycerides 163, HDL 33, LDL 101, total cholesterol 167.  PAST MEDICAL HISTORY : Reviewed.  No changes.  [facility]  REVIEW OF SYSTEMS:  GENERAL: no change in appetite, no fatigue, no weight changes, no fever, chills or weakness RESPIRATORY: no SOB, DOE, wheezing, hemoptysis CARDIAC: no chest pain, swelling or palpitations,  GI: no abdominal pain, diarrhea, constipation, nausea or vomiting  PHYSICAL EXAMINATION  VS: See vital signs section  GENERAL: no acute distress, moderately obese body habitus EYES: Normal sclerae, normal conjunctivae, no discharge NECK: supple, trachea midline, no neck masses, no thyroid tenderness, no thyromegaly LYMPHATICS: No cervical lymphadenopathy, no supraclavicular lymphadenopathy RESPIRATORY: breathing is even & unlabored, BS CTAB CARDIAC: RRR, no murmur,no extra heart sounds, +1 bilateral lower extremity edema GI: abdomen  soft, normal BS, no masses, no tenderness, no hepatomegaly, no splenomegaly PSYCHIATRIC: the patient is alert & oriented to person, affect & behavior appropriate  LABS/RADIOLOGY: 4-15 hemoglobin 10.1, MCV 84 otherwise CBC normal, BUN 40, creatinine 1.78 otherwise CMP normal, vitamin D level 24.9 12 -14 BUN 35, creatinine 1.8, chloride 1:15 otherwise BMP normal  10-14 BUN 34, creatinine 1.85, hemoglobin 9.6, MCV 81 otherwise CBC normal, glucose 103, BUN 37, creatinine 1.85 otherwise CMP normal 02/2013:  BUN 26, creatinine 1.36, repeat BMP showed BUN 33, creatinine 1.5 otherwise CMP normal  Hemoglobin 9.8, MCV 85, otherwise CBC normal.    Liver profile normal.    ASSESSMENT/PLAN:   Diabetes mellitus with renal complications.  Well controlled  GERD-denies symptoms  Hyperlipidemia.  Well controlled.   Allergic rhinitis.  on Claritin.    Renovascular hypertension. Uncontrolled. Start metoprolol 12.5 mg twice a day  Anemia of chronic kidney disease.  Stable.  Continue iron.  Dementia.  Stable.   Constipation.  Well controlled.    Vitamin D deficiency-on supplementation  CPT CODE: [id number]  Newton PiggGayani Y. Kerry Doryasanayaka, MD [facility] [phone]

## 2014-07-06 ENCOUNTER — Non-Acute Institutional Stay (SKILLED_NURSING_FACILITY): Payer: Medicare Other | Admitting: Internal Medicine

## 2014-07-06 DIAGNOSIS — F329 Major depressive disorder, single episode, unspecified: Secondary | ICD-10-CM

## 2014-07-06 DIAGNOSIS — F3289 Other specified depressive episodes: Secondary | ICD-10-CM

## 2014-07-06 DIAGNOSIS — F32A Depression, unspecified: Secondary | ICD-10-CM

## 2014-07-09 NOTE — Progress Notes (Signed)
Patient ID: Bianca HoppingJoylene Broxson, female   DOB: 11/12/1934, 80 y.o.   MRN: [id number]           PROGRESS NOTE  DATE: 07/06/2014         [facility]  LEVEL OF CARE: SNF (31)  Acute Visit  CHIEF COMPLAINT:  Manage depression.      HISTORY OF PRESENT ILLNESS: I was requested by the staff to assess the patient regarding above problem(s):  DEPRESSION:  I was requested by the pharmacy consultant to assess the patient for possible dose reduction of Cymbalta.    The depression remains stable. Patient denies ongoing feelings of sadness, insomnia, anedhonia or lack of appetite. No complications reported from the medications currently being used. Staff do not report behavioral problems.    PAST MEDICAL HISTORY : Reviewed.  No changes/see problem list  CURRENT MEDICATIONS: Reviewed per [facility]/see medication list  PHYSICAL EXAMINATION  VS: see VS section  GENERAL: no acute distress, moderately obese body habitus RESPIRATORY: breathing is even & unlabored, BS CTAB CARDIAC: RRR, no murmur,no extra heart sounds, +1 bilateral lower extremity edema      PSYCHIATRIC: the patient is alert & oriented to person, affect & behavior appropriate  ASSESSMENT/PLAN:  Depression.  Stable.  Patient denies ongoing symptoms.  Decrease Cymbalta to 30 mg q.d.     CPT CODE: [id number]           Angela CoxGayani Y Nezzie Manera, MD [facility] [phone]

## 2014-07-22 ENCOUNTER — Non-Acute Institutional Stay (SKILLED_NURSING_FACILITY): Payer: Medicare Other | Admitting: Internal Medicine

## 2014-07-22 DIAGNOSIS — I150 Renovascular hypertension: Secondary | ICD-10-CM

## 2014-07-22 DIAGNOSIS — E1129 Type 2 diabetes mellitus with other diabetic kidney complication: Secondary | ICD-10-CM

## 2014-07-22 DIAGNOSIS — K219 Gastro-esophageal reflux disease without esophagitis: Secondary | ICD-10-CM

## 2014-07-22 DIAGNOSIS — E7800 Pure hypercholesterolemia, unspecified: Secondary | ICD-10-CM

## 2014-07-24 NOTE — Progress Notes (Signed)
Patient ID: Bianca Wyatt, female   DOB: [birth year], 80 y.o.   MRN: [id number]         PROGRESS NOTE  DATE: 07-22-14  FACILITY: [facility]   LEVEL OF CARE: SNF  Routine Visit  CHIEF COMPLAINT:  Manage GERD, diabetes mellitus and hyperlipidemia.    HISTORY OF PRESENT ILLNESS:  REASSESSMENT OF ONGOING PROBLEM(S):  GERD:stable problem.  Denies of heartburn, denies abd. Pain, nausea or vomiting.  Currently on a PPI and tolerates it without any side effects.  DM:pt's DM remains stable.  Pt denies polyuria, polydipsia, polyphagia, changes in vision or hypoglycemic episodes.  No complications noted from the medication presently being used.  Last hemoglobin A1c is:  6.2 in 02/2013, in 7-14 hemoglobin A1c 6.3, in 10/14 hemoglobin A1c 7.1, in 4-15 hemoglobin A1c 6.6, in 7-15 hemoglobin A1c 6.4  HYPERLIPIDEMIA: No complications from the medications presently being used. Last fasting lipid panel showed :  02/2013:  Fasting lipid panel normal, in 10/14 HDL 35 otherwise fasting lipid panel normal, in 4-15 triglycerides 163, HDL 33, LDL 101, total cholesterol 167.  PAST MEDICAL HISTORY : Reviewed.  No changes.  CURRENT MEDICATIONS: Reviewed per [facility]  REVIEW OF SYSTEMS:  GENERAL: no change in appetite, no fatigue, no weight changes, no fever, chills or weakness RESPIRATORY: no SOB, DOE, wheezing, hemoptysis CARDIAC: no chest pain, swelling or palpitations,  GI: no abdominal pain, diarrhea, constipation, nausea or vomiting  PHYSICAL EXAMINATION  VS: See vital signs section  GENERAL: no acute distress, moderately obese body habitus NECK: supple, trachea midline, no neck masses, no thyroid tenderness, no thyromegaly RESPIRATORY: breathing is even & unlabored, BS CTAB CARDIAC: RRR, no murmur,no extra heart sounds, +1 bilateral lower extremity edema GI: abdomen soft, normal BS, no masses, no tenderness, no hepatomegaly, no splenomegaly PSYCHIATRIC: the patient is alert & oriented to person,  affect & behavior appropriate  LABS/RADIOLOGY: 4-15 hemoglobin 10.1, MCV 84 otherwise CBC normal, BUN 40, creatinine 1.78 otherwise CMP normal, vitamin D level 24.9 12 -14 BUN 35, creatinine 1.8, chloride 1:15 otherwise BMP normal  10-14 BUN 34, creatinine 1.85, hemoglobin 9.6, MCV 81 otherwise CBC normal, glucose 103, BUN 37, creatinine 1.85 otherwise CMP normal 02/2013:  BUN 26, creatinine 1.36, repeat BMP showed BUN 33, creatinine 1.5 otherwise CMP normal  Hemoglobin 9.8, MCV 85, otherwise CBC normal.    Liver profile normal.    ASSESSMENT/PLAN:   Diabetes mellitus with renal complications.  Well controlled  GERD-denies symptoms  Hyperlipidemia.  Well controlled.   Allergic rhinitis.  on Claritin.    Renovascular hypertension. Blood pressure borderline. Metoprolol was started.  Anemia of chronic kidney disease.  Stable.  Continue iron.  Dementia.  Stable.   Constipation.  Well controlled.    Vitamin D deficiency-on supplementation  CPT CODE: 78295  Newton Pigg. Kerry Dory, MD [facility] [phone]

## 2014-08-24 ENCOUNTER — Non-Acute Institutional Stay (SKILLED_NURSING_FACILITY): Payer: Medicare Other | Admitting: Internal Medicine

## 2014-08-24 DIAGNOSIS — R05 Cough: Secondary | ICD-10-CM

## 2014-08-24 DIAGNOSIS — I150 Renovascular hypertension: Secondary | ICD-10-CM

## 2014-08-24 DIAGNOSIS — R059 Cough, unspecified: Secondary | ICD-10-CM

## 2014-08-27 NOTE — Progress Notes (Signed)
Patient ID: Bianca Wyatt, female   DOB: 09/27/1934, 80 y.o.   MRN: [id number]           PROGRESS NOTE  DATE: 08/24/2014         [facility]  LEVEL OF CARE: SNF (31)  Acute Visit  CHIEF COMPLAINT:  Manage cough and hypertension.    HISTORY OF PRESENT ILLNESS: I was requested by the staff to assess the patient regarding above problem(s):  COUGH:  New problem.  Patient is complaining of a dry cough.  Denies shortness of breath, fever, chills or night sweats.  She does complain of pleuritic chest pains occasionally.  She cannot identify precipitating or alleviating factors.  There is no temporal relationship.    HTN: Pt 's HTN remains stable.  Denies CP, sob, DOE, pedal edema, headaches, dizziness or visual disturbances.  No complications from the medications currently being used.  Last BP :  120/78.  I was requested by the staff to evaluate the Lasix use.  The patient is refusing to take Lasix due to urinary frequency.    PAST MEDICAL HISTORY : Reviewed.  No changes/see problem list  CURRENT MEDICATIONS: Reviewed per [facility]/see medication list  REVIEW OF SYSTEMS:  GENERAL: no change in appetite, no fatigue, no weight changes, no fever, chills or weakness RESPIRATORY: see HPI      CARDIAC: no chest pain, edema or palpitations GI: no abdominal pain, diarrhea, constipation, heart burn, nausea or vomiting  PHYSICAL EXAMINATION  VS: see VS section  GENERAL: no acute distress, moderately obese body habitus EYES: conjunctivae normal, sclerae normal, normal eye lids NECK: supple, trachea midline, no neck masses, no thyroid tenderness, no thyromegaly LYMPHATICS: no LAN in the neck, no supraclavicular LAN RESPIRATORY: breathing is even & unlabored, BS CTAB CARDIAC: RRR, no murmur,no extra heart sounds, no edema  ASSESSMENT/PLAN:  Cough.  New problem.  Start Robitussin 10 cc t.i.d. for one week.      Hypertension.  Well controlled.  Patient agrees to take  Lasix every other day.   Therefore, we will change to 40 mg q.o.d. and monitor blood pressure.    CPT CODE: 8657899308            Angela CoxGayani Y Dasanayaka, MD [facility] [phone]

## 2014-08-31 ENCOUNTER — Non-Acute Institutional Stay (SKILLED_NURSING_FACILITY): Payer: Medicare Other | Admitting: Internal Medicine

## 2014-08-31 DIAGNOSIS — R1032 Left lower quadrant pain: Secondary | ICD-10-CM

## 2014-09-04 NOTE — Progress Notes (Signed)
Patient ID: Bianca Wyatt, female   DOB: 05/05/1934, 80 y.o.   MRN: [id number]           PROGRESS NOTE  DATE: 08/31/2014       [facility]  LEVEL OF CARE: SNF (31)  Acute Visit  CHIEF COMPLAINT:  Manage LLQ pain.    HISTORY OF PRESENT ILLNESS: I was requested by the staff to assess the patient regarding above problem(s):  Patient is complaining of sharp intermittent pain in the left lower quadrant area.  She denies diarrhea, constipation, nausea, vomiting, or lack of appetite.  She denies dysuria, hematuria, or flank pain.  She cannot identify precipitating or alleviating factors.  Symptoms have been present for several days.    PAST MEDICAL HISTORY : Reviewed.  No changes/see problem list  CURRENT MEDICATIONS: Reviewed per [facility]/see medication list  REVIEW OF SYSTEMS:  GENERAL: no change in appetite, no fatigue, no weight changes, no fever, chills or weakness RESPIRATORY: no cough, SOB, DOE,, wheezing, hemoptysis CARDIAC: no chest pain, edema or palpitations GI: see HPI      PHYSICAL EXAMINATION  VS: see VS section  GENERAL: no acute distress, moderately obese body habitus NECK: supple, trachea midline, no neck masses, no thyroid tenderness, no thyromegaly RESPIRATORY: breathing is even & unlabored, BS CTAB CARDIAC: RRR, no murmur,no extra heart sounds, +1 bilateral lower extremity edema      GI: there is tenderness to palpation in the left lower quadrant area; abdomen soft, normal BS, no masses, no hepatomegaly, no splenomegaly PSYCHIATRIC: the patient is alert & oriented to person, affect & behavior appropriate  ASSESSMENT/PLAN:  Left lower quadrant pain.  New problem.  Obtain UA, culture and sensitivities.  Also, check a CBC with diff and a BMP.     CPT CODE: 4696299308            Angela CoxGayani Y Boleslaw Borghi, MD [facility] [phone]

## 2014-09-07 ENCOUNTER — Non-Acute Institutional Stay (SKILLED_NURSING_FACILITY): Payer: Medicare Other | Admitting: Internal Medicine

## 2014-09-07 DIAGNOSIS — N189 Chronic kidney disease, unspecified: Secondary | ICD-10-CM

## 2014-09-07 DIAGNOSIS — E7800 Pure hypercholesterolemia, unspecified: Secondary | ICD-10-CM

## 2014-09-07 DIAGNOSIS — D631 Anemia in chronic kidney disease: Secondary | ICD-10-CM

## 2014-09-07 DIAGNOSIS — I150 Renovascular hypertension: Secondary | ICD-10-CM

## 2014-09-09 ENCOUNTER — Non-Acute Institutional Stay (SKILLED_NURSING_FACILITY): Payer: Medicare Other | Admitting: Internal Medicine

## 2014-09-09 DIAGNOSIS — E7800 Pure hypercholesterolemia, unspecified: Secondary | ICD-10-CM

## 2014-09-09 DIAGNOSIS — R1032 Left lower quadrant pain: Secondary | ICD-10-CM

## 2014-09-09 DIAGNOSIS — K219 Gastro-esophageal reflux disease without esophagitis: Secondary | ICD-10-CM

## 2014-09-09 DIAGNOSIS — E1122 Type 2 diabetes mellitus with diabetic chronic kidney disease: Secondary | ICD-10-CM

## 2014-09-09 DIAGNOSIS — N189 Chronic kidney disease, unspecified: Secondary | ICD-10-CM

## 2014-09-10 NOTE — Progress Notes (Signed)
Patient ID: Bianca Wyatt, female   DOB: 05/14/1934, 80 y.o.   MRN: [id number]           PROGRESS NOTE  DATE: 09/07/2014         [facility]  LEVEL OF CARE: SNF (31)  Acute Visit  CHIEF COMPLAINT:  Manage anemia of chronic kidney disease, chronic kidney disease, and hyperlipidemia.    HISTORY OF PRESENT ILLNESS: I was requested by the staff to assess the patient regarding above problem(s):  ANEMIA: The anemia is unstable. The patient denies fatigue, melena or hematochezia. No complications from the medications currently being used.  On 09/03/2014:  Hemoglobin 9.6, MCV 83.  In 02/2014:  Hemoglobin 10.1.    CHRONIC KIDNEY DISEASE: The patient's chronic kidney disease is unstable.  Patient denies increasing lower extremity swelling or confusion. Last BUN and creatinine are:   On 09/03/2014:  BUN 24, creatinine 1.88.  In 02/2014:  BUN 40, creatinine 1.78.    HYPERLIPIDEMIA: No complications from the medications presently being used. Last fasting lipid panel showed :   On 09/03/2014:  Triglycerides 164, HDL 30, LDL 68, total cholesterol 409131.    PAST MEDICAL HISTORY : Reviewed.  No changes/see problem list  CURRENT MEDICATIONS: Reviewed per [facility]/see medication list  REVIEW OF SYSTEMS:  GENERAL: no change in appetite, no fatigue, no weight changes, no fever, chills or weakness RESPIRATORY: no cough, SOB, DOE,, wheezing, hemoptysis CARDIAC: no chest pain, edema or palpitations GI: no abdominal pain, diarrhea, constipation, heart burn, nausea or vomiting  PHYSICAL EXAMINATION  VS: see VS section  GENERAL: no acute distress, moderately obese body habitus EYES: conjunctivae normal, sclerae normal, normal eye lids NECK: supple, trachea midline, no neck masses, no thyroid tenderness, no thyromegaly LYMPHATICS: no LAN in the neck, no supraclavicular LAN RESPIRATORY: breathing is even & unlabored, BS CTAB CARDIAC: RRR, no murmur,no extra heart sounds, no  edema GI: abdomen soft, normal BS, no masses, no tenderness, no hepatomegaly, no splenomegaly PSYCHIATRIC: the patient is alert & oriented to person, affect & behavior appropriate  ASSESSMENT/PLAN:  Chronic kidney disease.  Unstable problem.  Renal functions are worse.  Recheck on 09/11/2014.    Anemia of chronic kidney disease.  Unstable.  Hemoglobin declined.  We will monitor.    Hyperlipidemia.  Adequately controlled.  Continue Lipitor.    Renovascular hypertension.  Patient's Lasix was stopped due to her request.  She is requesting that it be restarted.  I will restart at 10 mg q.d. for now.  Blood pressure is borderline.    CPT CODE: [id number]           Bianca CoxGayani Y Tison Leibold, MD [facility] [phone]

## 2014-09-12 DIAGNOSIS — E119 Type 2 diabetes mellitus without complications: Secondary | ICD-10-CM | POA: Insufficient documentation

## 2014-09-12 NOTE — Progress Notes (Signed)
Patient ID: Bianca Wyatt, female   DOB: 06/15/1934, 80 y.o.   MRN: [id number]         PROGRESS NOTE  DATE: 09-09-14  FACILITY: [facility]   LEVEL OF CARE: SNF  Routine Visit  CHIEF COMPLAINT:  Manage GERD, diabetes mellitus and hyperlipidemia.    HISTORY OF PRESENT ILLNESS:  REASSESSMENT OF ONGOING PROBLEM(S):  GERD:stable problem.  Denies of heartburn, denies abd. Pain, nausea or vomiting.  Currently on a PPI and tolerates it without any side effects.  DM:pt's DM remains stable.  Pt denies polyuria, polydipsia, polyphagia, changes in vision or hypoglycemic episodes.  No complications noted from the medication presently being used.  Last hemoglobin A1c is:  6.2 in 02/2013, in 7-14 hemoglobin A1c 6.3, in 10/14 hemoglobin A1c 7.1, in 4-15 hemoglobin A1c 6.6, in 7-15 hemoglobin A1c 6.4  HYPERLIPIDEMIA: No complications from the medications presently being used. Last fasting lipid panel showed :  02/2013:  Fasting lipid panel normal, in 10/14 HDL 35 otherwise fasting lipid panel normal, in 4-15 triglycerides 163, HDL 33, LDL 101, total cholesterol 167.  PAST MEDICAL HISTORY : Reviewed.  No changes.  [facility]  REVIEW OF SYSTEMS:  GENERAL: no change in appetite, no fatigue, no weight changes, no fever, chills or weakness RESPIRATORY: no SOB, DOE, wheezing, hemoptysis CARDIAC: no chest pain, swelling or palpitations,  GI: no abdominal pain, diarrhea, constipation, nausea or vomiting, c/o left groin pain  PHYSICAL EXAMINATION  VS: See vital signs section  GENERAL: no acute distress, moderately obese body habitus EYES: normal sclerae, normal conjunctivae, no discharge NECK: supple, trachea midline, no neck masses, no thyroid tenderness, no thyromegaly LYMPHATICS: no cervical LAN, no supraclavicular LAN RESPIRATORY: breathing is even & unlabored, BS CTAB CARDIAC: RRR, no murmur,no extra heart sounds, +1 bilateral lower extremity edema GI: abdomen  soft, normal BS, no masses, no tenderness, no hepatomegaly, no splenomegaly, left groin tender to palpation PSYCHIATRIC: the patient is alert & oriented to person, affect & behavior appropriate  LABS/RADIOLOGY: 4-15 hemoglobin 10.1, MCV 84 otherwise CBC normal, BUN 40, creatinine 1.78 otherwise CMP normal, vitamin D level 24.9 12 -14 BUN 35, creatinine 1.8, chloride 1:15 otherwise BMP normal  10-14 BUN 34, creatinine 1.85, hemoglobin 9.6, MCV 81 otherwise CBC normal, glucose 103, BUN 37, creatinine 1.85 otherwise CMP normal 02/2013:  BUN 26, creatinine 1.36, repeat BMP showed BUN 33, creatinine 1.5 otherwise CMP normal  Hemoglobin 9.8, MCV 85, otherwise CBC normal.    Liver profile normal.    ASSESSMENT/PLAN:   Diabetes mellitus with renal complications.  Well controlled  GERD-denies symptoms  Hyperlipidemia.  Well controlled. Check FLP.  Allergic rhinitis.  on Claritin.    Renovascular hypertension. Well controlled.  Anemia of chronic kidney disease.  Stable.  Continue iron.  Recheck Hb.  Dementia.  Stable.   Constipation.  Well controlled.    Vitamin D deficiency-on supplementation  Left groin pain-check US  Check cbc & cmp  CPT CODE: [id number]  Newton PiggGayani Y. Kerry Doryasanayaka, MD [facility] [phone]

## 2014-09-14 ENCOUNTER — Non-Acute Institutional Stay (SKILLED_NURSING_FACILITY): Payer: Medicare Other | Admitting: Internal Medicine

## 2014-09-14 DIAGNOSIS — L22 Diaper dermatitis: Secondary | ICD-10-CM

## 2014-09-14 DIAGNOSIS — R1032 Left lower quadrant pain: Secondary | ICD-10-CM

## 2014-09-14 DIAGNOSIS — B372 Candidiasis of skin and nail: Secondary | ICD-10-CM

## 2014-09-16 ENCOUNTER — Non-Acute Institutional Stay (SKILLED_NURSING_FACILITY): Payer: Medicare Other | Admitting: Internal Medicine

## 2014-09-16 DIAGNOSIS — N189 Chronic kidney disease, unspecified: Secondary | ICD-10-CM

## 2014-09-16 DIAGNOSIS — L720 Epidermal cyst: Secondary | ICD-10-CM

## 2014-09-16 DIAGNOSIS — D631 Anemia in chronic kidney disease: Secondary | ICD-10-CM

## 2014-09-16 DIAGNOSIS — IMO0002 Reserved for concepts with insufficient information to code with codable children: Secondary | ICD-10-CM

## 2014-09-17 NOTE — Progress Notes (Signed)
Patient ID: Bianca Wyatt, female   DOB: 12/07/1933, 80 y.o.   MRN: [id number]           PROGRESS NOTE  DATE: 09/14/2014         [facility]  LEVEL OF CARE: SNF (31)  Acute Visit  CHIEF COMPLAINT:  Manage groin yeast.    HISTORY OF PRESENT ILLNESS: I was requested by the staff to assess the patient regarding above problem(s):  Staff report that patient has yeast in bilateral groin with foul odor.  Patient denies itching, but patient admits to ongoing left groin tenderness.    PAST MEDICAL HISTORY : Reviewed.  No changes/see problem list  CURRENT MEDICATIONS: Reviewed per MAR/see medication list  REVIEW OF SYSTEMS:  GENERAL: no change in appetite, no fatigue, no weight changes, no fever, chills or weakness RESPIRATORY: no cough, SOB, DOE,, wheezing, hemoptysis CARDIAC: no chest pain, edema or palpitations GI: no abdominal pain, diarrhea, constipation, heart burn, nausea or vomiting  PHYSICAL EXAMINATION  VS: see VS section  GENERAL: no acute distress, moderately obese body habitus NECK: supple, trachea midline, no neck masses, no thyroid tenderness, no thyromegaly RESPIRATORY: breathing is even & unlabored, BS CTAB CARDIAC: RRR, no murmur,no extra heart sounds, no edema GI: abdomen soft, normal BS, no masses, no tenderness, no hepatomegaly, no splenomegaly PSYCHIATRIC: the patient is alert & oriented to person, affect & behavior appropriate SKIN: bilateral groin has confluent edema, no open skin, left groin is tender to palpation         ASSESSMENT/PLAN:  Bilateral groin candidiasis.  New problem.  Start Nystatin cream b.i.d. for one week and fluconazole 150 mg q.d. for three days.    Left groin pain.  Obtain report of ultrasound.       CPT CODE: [id number]             Bianca CoxGayani Y Kamill Fulbright, MD [facility] [phone]

## 2014-09-17 NOTE — Progress Notes (Signed)
Patient ID: Bianca Wyatt, female   DOB: 10/19/1934, 80 y.o.   MRN: [id number]           PROGRESS NOTE  DATE: 09/16/2014             [facility]  LEVEL OF CARE: SNF (31)  Acute Visit  CHIEF COMPLAINT:  Manage anemia of chronic kidney disease, chronic kidney disease, and left groin cyst.  HISTORY OF PRESENT ILLNESS: I was requested by the staff to assess the patient regarding above problem(s):  ANEMIA: The anemia has been stable. The patient denies fatigue, melena or hematochezia. No complications from the medications currently being used.  On 09/11/2014:  Hemoglobin 10.1, MCV 80.  On 09/03/2014:  Hemoglobin 9.6.     CHRONIC KIDNEY DISEASE: The patient's chronic kidney disease remains stable.  Patient denies increasing lower extremity swelling or confusion. Last BUN and creatinine are:   On 09/11/2014:  BUN 29, creatinine 1.88.  On 09/03/2014:  BUN 24, creatinine 1.88.    LEFT GROIN CYST:  Due to pain in left groin, patient had an ultrasound done on 09/09/2014 which shows 1 x 0.7 cm complex cystic area.  Vascular structures are normal.  There is no solid mass.  Patient continues to complain of pain.    PAST MEDICAL HISTORY : Reviewed.  No changes/see problem list  CURRENT MEDICATIONS: Reviewed per MAR/see medication list  REVIEW OF SYSTEMS:  GENERAL: no change in appetite, no fatigue, no weight changes, no fever, chills or weakness RESPIRATORY: no cough, SOB, DOE,, wheezing, hemoptysis CARDIAC: no chest pain, edema or palpitations GI: no abdominal pain, diarrhea, constipation, heart burn, nausea or vomiting  PHYSICAL EXAMINATION  VS: see VS section  GENERAL: no acute distress, moderately obese body habitus EYES: conjunctivae normal, sclerae normal, normal eye lids NECK: supple, trachea midline, no neck masses, no thyroid tenderness, no thyromegaly LYMPHATICS: no LAN in the neck, no supraclavicular LAN RESPIRATORY: breathing is even & unlabored, BS  CTAB CARDIAC: RRR, no murmur,no extra heart sounds, +1 bilateral lower extremity edema      GI: left groin tender to palpation, abdomen soft, normal BS, no masses, no hepatomegaly, no splenomegaly PSYCHIATRIC: the patient is alert & oriented to person, affect & behavior appropriate  ASSESSMENT/PLAN:  Anemia of chronic kidney disease.  Hemoglobin improved.     Chronic kidney disease.  Renal functions stable.    Left groin cyst.  Due to ongoing pain, we will obtain a General Surgery consultation for further evaluation.    CPT CODE: [id number]            Bianca CoxGayani Y Daylen Hack, MD [facility] [phone]

## 2016-01-11 ENCOUNTER — Emergency Department (HOSPITAL_COMMUNITY): Payer: Medicare Other

## 2016-01-11 ENCOUNTER — Encounter (HOSPITAL_COMMUNITY): Payer: Self-pay

## 2016-01-11 ENCOUNTER — Inpatient Hospital Stay (HOSPITAL_COMMUNITY)
Admission: EM | Admit: 2016-01-11 | Discharge: 2016-01-14 | DRG: 872 | Disposition: A | Discharge status: Skilled Nursing Facility | Payer: Medicare Other | Attending: Internal Medicine | Admitting: Internal Medicine

## 2016-01-11 DIAGNOSIS — Z794 Long term (current) use of insulin: Secondary | ICD-10-CM

## 2016-01-11 DIAGNOSIS — K529 Noninfective gastroenteritis and colitis, unspecified: Secondary | ICD-10-CM | POA: Diagnosis present

## 2016-01-11 DIAGNOSIS — D649 Anemia, unspecified: Secondary | ICD-10-CM

## 2016-01-11 DIAGNOSIS — F419 Anxiety disorder, unspecified: Secondary | ICD-10-CM | POA: Diagnosis present

## 2016-01-11 DIAGNOSIS — A419 Sepsis, unspecified organism: Principal | ICD-10-CM | POA: Diagnosis present

## 2016-01-11 DIAGNOSIS — E119 Type 2 diabetes mellitus without complications: Secondary | ICD-10-CM

## 2016-01-11 DIAGNOSIS — F329 Major depressive disorder, single episode, unspecified: Secondary | ICD-10-CM | POA: Diagnosis present

## 2016-01-11 DIAGNOSIS — F0390 Unspecified dementia without behavioral disturbance: Secondary | ICD-10-CM | POA: Diagnosis present

## 2016-01-11 DIAGNOSIS — K625 Hemorrhage of anus and rectum: Secondary | ICD-10-CM

## 2016-01-11 DIAGNOSIS — F32A Depression, unspecified: Secondary | ICD-10-CM | POA: Diagnosis present

## 2016-01-11 DIAGNOSIS — N183 Chronic kidney disease, stage 3 unspecified: Secondary | ICD-10-CM | POA: Diagnosis present

## 2016-01-11 DIAGNOSIS — E1122 Type 2 diabetes mellitus with diabetic chronic kidney disease: Secondary | ICD-10-CM | POA: Diagnosis present

## 2016-01-11 DIAGNOSIS — I130 Hypertensive heart and chronic kidney disease with heart failure and stage 1 through stage 4 chronic kidney disease, or unspecified chronic kidney disease: Secondary | ICD-10-CM | POA: Diagnosis present

## 2016-01-11 DIAGNOSIS — K219 Gastro-esophageal reflux disease without esophagitis: Secondary | ICD-10-CM | POA: Diagnosis present

## 2016-01-11 DIAGNOSIS — Z86711 Personal history of pulmonary embolism: Secondary | ICD-10-CM

## 2016-01-11 DIAGNOSIS — I2699 Other pulmonary embolism without acute cor pulmonale: Secondary | ICD-10-CM | POA: Diagnosis present

## 2016-01-11 DIAGNOSIS — E7800 Pure hypercholesterolemia, unspecified: Secondary | ICD-10-CM | POA: Diagnosis present

## 2016-01-11 DIAGNOSIS — E785 Hyperlipidemia, unspecified: Secondary | ICD-10-CM | POA: Diagnosis present

## 2016-01-11 DIAGNOSIS — R1010 Upper abdominal pain, unspecified: Secondary | ICD-10-CM

## 2016-01-11 DIAGNOSIS — R569 Unspecified convulsions: Secondary | ICD-10-CM | POA: Diagnosis present

## 2016-01-11 DIAGNOSIS — D6832 Hemorrhagic disorder due to extrinsic circulating anticoagulants: Secondary | ICD-10-CM

## 2016-01-11 DIAGNOSIS — Z7901 Long term (current) use of anticoagulants: Secondary | ICD-10-CM

## 2016-01-11 DIAGNOSIS — I5032 Chronic diastolic (congestive) heart failure: Secondary | ICD-10-CM

## 2016-01-11 DIAGNOSIS — R791 Abnormal coagulation profile: Secondary | ICD-10-CM | POA: Diagnosis present

## 2016-01-11 DIAGNOSIS — T45515A Adverse effect of anticoagulants, initial encounter: Secondary | ICD-10-CM | POA: Diagnosis present

## 2016-01-11 HISTORY — DX: Chronic kidney disease, stage 3 unspecified: N18.30

## 2016-01-11 HISTORY — DX: Unspecified dementia, unspecified severity, without behavioral disturbance, psychotic disturbance, mood disturbance, and anxiety: F03.90

## 2016-01-11 HISTORY — DX: Chronic kidney disease, stage 3 (moderate): N18.3

## 2016-01-11 HISTORY — DX: Chronic diastolic (congestive) heart failure: I50.32

## 2016-01-11 HISTORY — DX: Other pulmonary embolism without acute cor pulmonale: I26.99

## 2016-01-11 MED ORDER — SODIUM CHLORIDE 0.9 % IV BOLUS (SEPSIS)
500.0000 mL | Freq: Once | INTRAVENOUS | Status: AC
  Administered 2016-01-12: 500 mL via INTRAVENOUS

## 2016-01-11 NOTE — ED Notes (Signed)
Pt complains of abdominal pain and GI bleed two days ago and then was told she had some abnormal labs, wanted to be evaluated here

## 2016-01-11 NOTE — ED Notes (Signed)
Bed: ZO10 Expected date:  Expected time:  Means of arrival:  Comments: Abnormal labs, 82 yr old

## 2016-01-11 NOTE — ED Notes (Signed)
MD at bedside. 

## 2016-01-11 NOTE — ED Notes (Signed)
Pt takes coumadin but none taken today.

## 2016-01-11 NOTE — ED Provider Notes (Signed)
CSN: [id number]     Arrival date & time 01/11/16  2133 History  By signing my name below, I, Soijett Blue, attest that this documentation has been prepared under the direction and in the presence of Devoria Albe, MD at 2324. Electronically Signed: Soijett Blue, ED Scribe. 01/11/2016. 11:36 PM.    Chief Complaint  Patient presents with  . Abnormal Lab      The history is provided by the patient. No language interpreter was used.    Bianca Wyatt is a 82 y.o. female with a medical hx of DM, HTN, seizures, who presents to the Emergency Department via EMS complaining of bright red rectal bleeding x 4 days. She reports that intermittently when she has a bowel movement there is only blood present. Pt states that she has never had this issue in the past nor has she had an endoscopy or colonoscopy. Pt is on coumadin with her last labs being 1 month ago. Patient states she is on Coumadin to prevent blood clots going to her long. Review of her PCP notes states she is on the coumadin to treat pulmonary embolism. Pt is having associated symptoms of abdominal pain and nausea. She notes that she has not tried any medications for the relief of her symptoms. She denies vomiting, dizziness, lightheadedness, CP, SOB, and any other symptoms. Pt PCP is Dr. Nehemiah Settle at [facility]. Denies prior stroke, irregular heartbeat, or blood clot in lungs. Pt denies smoking cigarettes or drinking alcohol. Pt lives in [place], rehabilitation center which she has been in x 8-9 years. Pt was placed in [place] by her son. Pt is able to ambulate with a walker.   PCP Dr Nehemiah Settle GI none  Past Medical History  Diagnosis Date  . Diabetes mellitus without complication (HCC)   . Hypertension   . Anemia   . Allergy   . Arthritis   . Seizures (HCC)   . Syncope   . Chronic diastolic (congestive) heart failure (HCC)   . PE (pulmonary embolism)   . CKD (chronic kidney disease), stage III   . Chronic diastolic (congestive)  heart failure (HCC)   . Dementia    Past Surgical History  Procedure Laterality Date  . Exploratory lap for sbo  2012   Family History  Problem Relation Age of Onset  . Heart disease Mother   . Heart disease Father    Social History  Substance Use Topics  . Smoking status: Never Smoker   . Smokeless tobacco: None  . Alcohol Use: None   Uses a walker Lives in a rehab facility  OB History    No data available     Review of Systems  Respiratory: Negative for shortness of breath.   Cardiovascular: Negative for chest pain.  Gastrointestinal: Positive for nausea, abdominal pain, blood in stool and anal bleeding. Negative for vomiting.  Neurological: Negative for dizziness and light-headedness.  All other systems reviewed and are negative.     Allergies  Review of patient's allergies indicates no known allergies.  Home Medications   Prior to Admission medications   Medication Sig Start Date End Date Taking? Authorizing Provider  amLODipine (NORVASC) 10 MG tablet Take 10 mg by mouth daily.   Yes Historical Provider, MD  atorvastatin (LIPITOR) 10 MG tablet Take 10 mg by mouth daily.    Yes Historical Provider, MD  calcitRIOL (ROCALTROL) 0.25 MCG capsule Take 0.25 mcg by mouth 3 (three) times a week. Mon, Wed, Fri. 01/11/16  Yes Historical  Provider, MD  Cholecalciferol (VITAMIN D) 2000 units tablet Take 2,000 Units by mouth daily.   Yes Historical Provider, MD  DULoxetine (CYMBALTA) 30 MG capsule Take 30 mg by mouth daily.  01/10/16  Yes Historical Provider, MD  furosemide (LASIX) 20 MG tablet Take 10 mg by mouth daily.  01/03/16  Yes Historical Provider, MD  HYDROcodone-acetaminophen (NORCO/VICODIN) 5-325 MG per tablet Take 1 tablet by mouth every 4 (four) hours as needed for pain. Patient not taking: Reported on 01/11/2016 06/11/13   Oneal Grout, MD  insulin aspart (NOVOLOG) 100 UNIT/ML injection Inject 0-10 Units into the skin 3 (three) times daily as needed for high blood  sugar. Sliding Scale. 200-250=2 units, 251-300=4 units, 301-350 units=6 units, 351-400=8 units, 400=Call MD.    Historical Provider, MD  insulin glargine (LANTUS) 100 UNIT/ML injection Inject 11 Units into the skin at bedtime.   Yes Historical Provider, MD  iron polysaccharides (NIFEREX) 150 MG capsule Take 150 mg by mouth 2 (two) times daily.   Yes Historical Provider, MD  latanoprost (XALATAN) 0.005 % ophthalmic solution Place 1 drop into both eyes at bedtime.  12/29/15  Yes Historical Provider, MD  lisinopril (PRINIVIL,ZESTRIL) 40 MG tablet Take 40 mg by mouth daily.   Yes Historical Provider, MD  Menthol, Topical Analgesic, (BIOFREEZE) 4 % GEL Apply 1 application topically 2 (two) times daily. To back and knees   Yes Historical Provider, MD  metoprolol tartrate (LOPRESSOR) 25 MG tablet Take 12.5 mg by mouth 2 (two) times daily.  01/05/16  Yes Historical Provider, MD  omeprazole (PRILOSEC) 20 MG capsule Take 20 mg by mouth 2 (two) times daily before a meal.   Yes Historical Provider, MD  oxybutynin (DITROPAN-XL) 10 MG 24 hr tablet Take 10 mg by mouth daily.    Yes Historical Provider, MD  Polyvinyl Alcohol-Povidone (FRESHKOTE) 2.7-2 % SOLN Apply 1 drop to eye 3 (three) times daily.   Yes Historical Provider, MD  rivastigmine (EXELON) 6 MG capsule Take 6 mg by mouth 2 (two) times daily.   Yes Historical Provider, MD  warfarin (COUMADIN) 3 MG tablet Take 3 mg by mouth daily.   Yes Historical Provider, MD   BP 127/63 mmHg  Pulse 71  Temp(Src) 98.5 F (36.9 C) (Oral)  Resp 22  Ht 5\' 2"  (1.575 m)  Wt 198 lb (89.812 kg)  BMI 36.21 kg/m2  SpO2 98%  Vital signs normal   Physical Exam  Constitutional: She is oriented to person, place, and time. She appears well-developed and well-nourished.  Non-toxic appearance. She does not appear ill. No distress.  HENT:  Head: Normocephalic and atraumatic.  Right Ear: External ear normal.  Left Ear: External ear normal.  Nose: Nose normal. No mucosal edema  or rhinorrhea.  Mouth/Throat: Oropharynx is clear and moist and mucous membranes are normal. No dental abscesses or uvula swelling.  Eyes: Conjunctivae and EOM are normal. Pupils are equal, round, and reactive to light.  Neck: Normal range of motion and full passive range of motion without pain. Neck supple.  Cardiovascular: Normal rate, regular rhythm and normal heart sounds.  Exam reveals no gallop and no friction rub.   No murmur heard. Pulmonary/Chest: Effort normal and breath sounds normal. No respiratory distress. She has no wheezes. She has no rhonchi. She has no rales. She exhibits no tenderness and no crepitus.  Abdominal: Soft. Normal appearance and bowel sounds are normal. She exhibits no distension. There is tenderness in the epigastric area and periumbilical area. There is no  rebound and no guarding.    Musculoskeletal: Normal range of motion. She exhibits no edema or tenderness.  Moves all extremities well.   Neurological: She is alert and oriented to person, place, and time. She has normal strength. No cranial nerve deficit.  Skin: Skin is warm, dry and intact. No rash noted. No erythema. No pallor.  Psychiatric: She has a normal mood and affect. Her speech is normal and behavior is normal. Her mood appears not anxious.  Nursing note and vitals reviewed.   ED Course  Procedures (including critical care time)  Medications  sodium chloride 0.9 % bolus 500 mL (0 mLs Intravenous Stopped 01/12/16 0322)  ciprofloxacin (CIPRO) IVPB 400 mg (0 mg Intravenous Stopped 01/12/16 0423)  metroNIDAZOLE (FLAGYL) IVPB 500 mg (0 mg Intravenous Stopped 01/12/16 0309)  fentaNYL (SUBLIMAZE) injection 50 mcg (50 mcg Intravenous Given 01/12/16 0209)  fentaNYL (SUBLIMAZE) injection 50 mcg (50 mcg Intravenous Given 01/12/16 0457)  sodium chloride 0.9 % bolus 1,500 mL (0 mLs Intravenous Stopped 01/12/16 0701)    DIAGNOSTIC STUDIES: Oxygen Saturation is 100% on RA, nl by my interpretation.     COORDINATION OF CARE: 11:35 PM Discussed treatment plan with pt at bedside which includes labs, CT abdomen pelvis with contrast and pt agreed to plan. Patient was given IV fluids and given IV pain  Medication.  2 AM after reviewing her CT scan she was started on IV Cipro and Flagyl for her colitis.  3:07 AM- Pt re-evaluated and informed that admission would be needed for observation of her INR levels. Pt will be given repeat pain medications.  3:38 AM- Consult with Dr. Clyde Lundborg, Hospitalist, who accepts the pt for admit to telemetry.   Results for orders placed or performed during the hospital encounter of 01/11/16  Comprehensive metabolic panel  Result Value Ref Range   Sodium 145 135 - 145 mmol/L   Potassium 3.9 3.5 - 5.1 mmol/L   Chloride 116 (H) 101 - 111 mmol/L   CO2 19 (L) 22 - 32 mmol/L   Glucose, Bld 118 (H) 65 - 99 mg/dL   BUN 30 (H) 6 - 20 mg/dL   Creatinine, Ser 1.61 (H) 0.44 - 1.00 mg/dL   Calcium 9.4 8.9 - 09.6 mg/dL   Total Protein 8.1 6.5 - 8.1 g/dL   Albumin 3.6 3.5 - 5.0 g/dL   AST 21 15 - 41 U/L   ALT 20 14 - 54 U/L   Alkaline Phosphatase 75 38 - 126 U/L   Total Bilirubin 0.7 0.3 - 1.2 mg/dL   GFR calc non Af Amer 23 (L) >60 mL/min   GFR calc Af Amer 26 (L) >60 mL/min   Anion gap 10 5 - 15  CBC with Differential  Result Value Ref Range   WBC 20.9 (H) 4.0 - 10.5 K/uL   RBC 3.69 (L) 3.87 - 5.11 MIL/uL   Hemoglobin 10.0 (L) 12.0 - 15.0 g/dL   HCT 04.5 (L) 40.9 - 81.1 %   MCV 88.1 78.0 - 100.0 fL   MCH 27.1 26.0 - 34.0 pg   MCHC 30.8 30.0 - 36.0 g/dL   RDW 91.4 (H) 78.2 - 95.6 %   Platelets 312 150 - 400 K/uL   Neutrophils Relative % 86 %   Neutro Abs 17.9 (H) 1.7 - 7.7 K/uL   Lymphocytes Relative 8 %   Lymphs Abs 1.7 0.7 - 4.0 K/uL   Monocytes Relative 6 %   Monocytes Absolute 1.3 (H) 0.1 - 1.0 K/uL  Eosinophils Relative 0 %   Eosinophils Absolute 0.0 0.0 - 0.7 K/uL   Basophils Relative 0 %   Basophils Absolute 0.0 0.0 - 0.1 K/uL  Protime-INR  Result  Value Ref Range   Prothrombin Time 37.2 (H) 11.6 - 15.2 seconds   INR 4.05 (H) 0.00 - 1.49  APTT  Result Value Ref Range   aPTT 72 (H) 24 - 37 seconds  CBC  Result Value Ref Range   WBC 20.5 (H) 4.0 - 10.5 K/uL   RBC 3.13 (L) 3.87 - 5.11 MIL/uL   Hemoglobin 8.7 (L) 12.0 - 15.0 g/dL   HCT 16.1 (L) 09.6 - 04.5 %   MCV 87.5 78.0 - 100.0 fL   MCH 27.8 26.0 - 34.0 pg   MCHC 31.8 30.0 - 36.0 g/dL   RDW 40.9 (H) 81.1 - 91.4 %   Platelets 294 150 - 400 K/uL  Brain natriuretic peptide  Result Value Ref Range   B Natriuretic Peptide 180.2 (H) 0.0 - 100.0 pg/mL  Lactic acid, plasma  Result Value Ref Range   Lactic Acid, Venous 1.2 0.5 - 2.0 mmol/L  Procalcitonin  Result Value Ref Range   Procalcitonin 0.53 ng/mL  Basic metabolic panel  Result Value Ref Range   Sodium 143 135 - 145 mmol/L   Potassium 3.4 (L) 3.5 - 5.1 mmol/L   Chloride 116 (H) 101 - 111 mmol/L   CO2 18 (L) 22 - 32 mmol/L   Glucose, Bld 113 (H) 65 - 99 mg/dL   BUN 27 (H) 6 - 20 mg/dL   Creatinine, Ser 7.82 (H) 0.44 - 1.00 mg/dL   Calcium 8.7 (L) 8.9 - 10.3 mg/dL   GFR calc non Af Amer 25 (L) >60 mL/min   GFR calc Af Amer 29 (L) >60 mL/min   Anion gap 9 5 - 15  Protime-INR  Result Value Ref Range   Prothrombin Time 41.1 (H) 11.6 - 15.2 seconds   INR 4.62 (H) 0.00 - 1.49  POC occult blood, ED RN will collect  Result Value Ref Range   Fecal Occult Bld NEGATIVE NEGATIVE  Sample to Blood Bank  Result Value Ref Range   Blood Bank Specimen SAMPLE AVAILABLE FOR TESTING    Sample Expiration 01/14/2016   Type and screen [facility]  Result Value Ref Range   ABO/RH(D) A POS    Antibody Screen POS    Sample Expiration 01/14/2016    DAT, IgG NEG     Laboratory interpretation all normal except mild anemia, over therapeutic INR from her Coumadin, renal insufficiency, leukocytosis   Ct Abdomen Pelvis Wo Contrast  01/12/2016  CLINICAL DATA:  Acute onset of intermittent rectal bleeding and upper  abdominal pain. Nausea and leukocytosis. Initial encounter. EXAM: CT ABDOMEN AND PELVIS WITHOUT CONTRAST TECHNIQUE: Multidetector CT imaging of the abdomen and pelvis was performed following the standard protocol without IV contrast. COMPARISON:  CT of the abdomen and pelvis from 10/18/2010, and renal ultrasound performed 01/14/2011 FINDINGS: The visualized lung bases are clear. Scattered coronary artery calcification is noted. Mitral valve calcification is noted. The liver and spleen are unremarkable in appearance. Stones are seen dependently within the gallbladder. The gallbladder is otherwise unremarkable. The pancreas and adrenal glands are unremarkable. The kidneys are unremarkable in appearance. There is no evidence of hydronephrosis. No renal or ureteral stones are seen. No perinephric stranding is appreciated. No free fluid is identified. The small bowel is unremarkable in appearance. The stomach is within normal limits. No  acute vascular abnormalities are seen. Scattered calcification is noted along the abdominal aorta and its branches. Diffuse wall thickening is noted along the transverse and descending colon, with associated soft tissue inflammation, compatible with segmental colitis, either infectious or inflammatory in nature. The appendix is not definitely characterized; there is no evidence of appendicitis. Scattered diverticulosis is noted along the descending and sigmoid colon, without evidence of diverticulitis. The bladder is moderately distended and grossly unremarkable. The patient appears status post hysterectomy. A small amount of free fluid is seen within the pelvis. No suspicious adnexal masses are seen. No inguinal lymphadenopathy is seen. No acute osseous abnormalities are identified. There is mild grade 1 anterolisthesis of L4 on L5, reflecting underlying facet disease. IMPRESSION: 1. Diffuse wall thickening along the transverse and descending colon, with associated soft tissue  inflammation, compatible with segmental colitis, either infectious or inflammatory in nature. 2. Small amount of free fluid within the pelvis may reflect the colitis. 3. Cholelithiasis.  Gallbladder otherwise unremarkable. 4. Scattered calcification along the abdominal aorta and its branches. 5. Scattered coronary artery calcification noted. Mitral valve calcification seen. 6. Scattered diverticulosis along the descending and sigmoid colon, without evidence of diverticulitis. Electronically Signed   By: Roanna Raider M.D.   On: 01/12/2016 01:55     I have personally reviewed and evaluated these images and lab results as part of my medical decision-making.    MDM   Final diagnoses:  Colitis  Rectal bleeding  Warfarin-induced coagulopathy (HCC)  Anemia, unspecified    Plan admission  Devoria Albe, MD, FACEP   I personally performed the services described in this documentation, which was scribed in my presence. The recorded information has been reviewed and considered.  Devoria Albe, MD, Concha Pyo, MD 01/12/16 [phone]

## 2016-01-12 ENCOUNTER — Encounter (HOSPITAL_COMMUNITY): Payer: Self-pay | Admitting: Internal Medicine

## 2016-01-12 DIAGNOSIS — Z7901 Long term (current) use of anticoagulants: Secondary | ICD-10-CM | POA: Diagnosis not present

## 2016-01-12 DIAGNOSIS — I2782 Chronic pulmonary embolism: Secondary | ICD-10-CM | POA: Diagnosis not present

## 2016-01-12 DIAGNOSIS — F0390 Unspecified dementia without behavioral disturbance: Secondary | ICD-10-CM

## 2016-01-12 DIAGNOSIS — A419 Sepsis, unspecified organism: Principal | ICD-10-CM

## 2016-01-12 DIAGNOSIS — N183 Chronic kidney disease, stage 3 unspecified: Secondary | ICD-10-CM | POA: Diagnosis present

## 2016-01-12 DIAGNOSIS — T45511A Poisoning by anticoagulants, accidental (unintentional), initial encounter: Secondary | ICD-10-CM

## 2016-01-12 DIAGNOSIS — D689 Coagulation defect, unspecified: Secondary | ICD-10-CM

## 2016-01-12 DIAGNOSIS — K529 Noninfective gastroenteritis and colitis, unspecified: Secondary | ICD-10-CM | POA: Diagnosis present

## 2016-01-12 DIAGNOSIS — E785 Hyperlipidemia, unspecified: Secondary | ICD-10-CM | POA: Diagnosis present

## 2016-01-12 DIAGNOSIS — Z86711 Personal history of pulmonary embolism: Secondary | ICD-10-CM | POA: Diagnosis not present

## 2016-01-12 DIAGNOSIS — I5032 Chronic diastolic (congestive) heart failure: Secondary | ICD-10-CM | POA: Diagnosis present

## 2016-01-12 DIAGNOSIS — R791 Abnormal coagulation profile: Secondary | ICD-10-CM | POA: Diagnosis present

## 2016-01-12 DIAGNOSIS — F329 Major depressive disorder, single episode, unspecified: Secondary | ICD-10-CM

## 2016-01-12 DIAGNOSIS — K625 Hemorrhage of anus and rectum: Secondary | ICD-10-CM

## 2016-01-12 DIAGNOSIS — R569 Unspecified convulsions: Secondary | ICD-10-CM | POA: Diagnosis present

## 2016-01-12 DIAGNOSIS — I130 Hypertensive heart and chronic kidney disease with heart failure and stage 1 through stage 4 chronic kidney disease, or unspecified chronic kidney disease: Secondary | ICD-10-CM | POA: Diagnosis present

## 2016-01-12 DIAGNOSIS — D6832 Hemorrhagic disorder due to extrinsic circulating anticoagulants: Secondary | ICD-10-CM | POA: Insufficient documentation

## 2016-01-12 DIAGNOSIS — Z794 Long term (current) use of insulin: Secondary | ICD-10-CM | POA: Diagnosis not present

## 2016-01-12 DIAGNOSIS — F419 Anxiety disorder, unspecified: Secondary | ICD-10-CM | POA: Diagnosis present

## 2016-01-12 DIAGNOSIS — T45515A Adverse effect of anticoagulants, initial encounter: Secondary | ICD-10-CM

## 2016-01-12 DIAGNOSIS — E7800 Pure hypercholesterolemia, unspecified: Secondary | ICD-10-CM | POA: Diagnosis present

## 2016-01-12 DIAGNOSIS — E1122 Type 2 diabetes mellitus with diabetic chronic kidney disease: Secondary | ICD-10-CM | POA: Diagnosis present

## 2016-01-12 DIAGNOSIS — K219 Gastro-esophageal reflux disease without esophagitis: Secondary | ICD-10-CM | POA: Diagnosis present

## 2016-01-12 LAB — CBC
HCT: 27.4 % — ABNORMAL LOW (ref 36.0–46.0)
HCT: 30.6 % — ABNORMAL LOW (ref 36.0–46.0)
HEMOGLOBIN: 8.7 g/dL — AB (ref 12.0–15.0)
Hemoglobin: 10 g/dL — ABNORMAL LOW (ref 12.0–15.0)
MCH: 27.8 pg (ref 26.0–34.0)
MCH: 28.4 pg (ref 26.0–34.0)
MCHC: 31.8 g/dL (ref 30.0–36.0)
MCHC: 32.7 g/dL (ref 30.0–36.0)
MCV: 87.5 fL (ref 78.0–100.0)
MCV: 86.9 fL (ref 78.0–100.0)
PLATELETS: 273 10*3/uL (ref 150–400)
Platelets: 294 10*3/uL (ref 150–400)
RBC: 3.13 MIL/uL — ABNORMAL LOW (ref 3.87–5.11)
RBC: 3.52 MIL/uL — ABNORMAL LOW (ref 3.87–5.11)
RDW: 15.8 % — ABNORMAL HIGH (ref 11.5–15.5)
RDW: 15.6 % — AB (ref 11.5–15.5)
WBC: 20.5 10*3/uL — ABNORMAL HIGH (ref 4.0–10.5)
WBC: 19 10*3/uL — AB (ref 4.0–10.5)

## 2016-01-12 LAB — COMPREHENSIVE METABOLIC PANEL
ALK PHOS: 75 U/L (ref 38–126)
ALT: 20 U/L (ref 14–54)
AST: 21 U/L (ref 15–41)
Albumin: 3.6 g/dL (ref 3.5–5.0)
Anion gap: 10 (ref 5–15)
BUN: 30 mg/dL — ABNORMAL HIGH (ref 6–20)
CALCIUM: 9.4 mg/dL (ref 8.9–10.3)
CO2: 19 mmol/L — AB (ref 22–32)
CREATININE: 1.99 mg/dL — AB (ref 0.44–1.00)
Chloride: 116 mmol/L — ABNORMAL HIGH (ref 101–111)
GFR, EST AFRICAN AMERICAN: 26 mL/min — AB (ref >60)
GFR, EST NON AFRICAN AMERICAN: 23 mL/min — AB (ref >60)
Glucose, Bld: 118 mg/dL — ABNORMAL HIGH (ref 65–99)
Potassium: 3.9 mmol/L (ref 3.5–5.1)
SODIUM: 145 mmol/L (ref 135–145)
Total Bilirubin: 0.7 mg/dL (ref 0.3–1.2)
Total Protein: 8.1 g/dL (ref 6.5–8.1)

## 2016-01-12 LAB — CBC WITH DIFFERENTIAL/PLATELET
BASOS PCT: 0 %
Basophils Absolute: 0 10*3/uL (ref 0.0–0.1)
EOS ABS: 0 10*3/uL (ref 0.0–0.7)
Eosinophils Relative: 0 %
HCT: 32.5 % — ABNORMAL LOW (ref 36.0–46.0)
HEMOGLOBIN: 10 g/dL — AB (ref 12.0–15.0)
Lymphocytes Relative: 8 %
Lymphs Abs: 1.7 10*3/uL (ref 0.7–4.0)
MCH: 27.1 pg (ref 26.0–34.0)
MCHC: 30.8 g/dL (ref 30.0–36.0)
MCV: 88.1 fL (ref 78.0–100.0)
MONOS PCT: 6 %
Monocytes Absolute: 1.3 10*3/uL — ABNORMAL HIGH (ref 0.1–1.0)
NEUTROS PCT: 86 %
Neutro Abs: 17.9 10*3/uL — ABNORMAL HIGH (ref 1.7–7.7)
PLATELETS: 312 10*3/uL (ref 150–400)
RBC: 3.69 MIL/uL — ABNORMAL LOW (ref 3.87–5.11)
RDW: 15.8 % — ABNORMAL HIGH (ref 11.5–15.5)
WBC: 20.9 10*3/uL — AB (ref 4.0–10.5)

## 2016-01-12 LAB — GLUCOSE, CAPILLARY
GLUCOSE-CAPILLARY: 79 mg/dL (ref 65–99)
GLUCOSE-CAPILLARY: 102 mg/dL — AB (ref 65–99)

## 2016-01-12 LAB — BASIC METABOLIC PANEL
ANION GAP: 9 (ref 5–15)
BUN: 27 mg/dL — ABNORMAL HIGH (ref 6–20)
CALCIUM: 8.7 mg/dL — AB (ref 8.9–10.3)
CO2: 18 mmol/L — ABNORMAL LOW (ref 22–32)
Chloride: 116 mmol/L — ABNORMAL HIGH (ref 101–111)
Creatinine, Ser: 1.84 mg/dL — ABNORMAL HIGH (ref 0.44–1.00)
GFR, EST AFRICAN AMERICAN: 29 mL/min — AB (ref >60)
GFR, EST NON AFRICAN AMERICAN: 25 mL/min — AB (ref >60)
Glucose, Bld: 113 mg/dL — ABNORMAL HIGH (ref 65–99)
Potassium: 3.4 mmol/L — ABNORMAL LOW (ref 3.5–5.1)
SODIUM: 143 mmol/L (ref 135–145)

## 2016-01-12 LAB — CBG MONITORING, ED
GLUCOSE-CAPILLARY: 96 mg/dL (ref 65–99)
Glucose-Capillary: 90 mg/dL (ref 65–99)

## 2016-01-12 LAB — SAMPLE TO BLOOD BANK

## 2016-01-12 LAB — C DIFFICILE QUICK SCREEN W PCR REFLEX
C DIFFICILE (CDIFF) TOXIN: NEGATIVE
C DIFFICLE (CDIFF) ANTIGEN: NEGATIVE
C Diff interpretation: NEGATIVE

## 2016-01-12 LAB — PROTIME-INR
INR: 4.05 — AB (ref 0.00–1.49)
INR: 4.62 — AB (ref 0.00–1.49)
PROTHROMBIN TIME: 37.2 s — AB (ref 11.6–15.2)
PROTHROMBIN TIME: 41.1 s — AB (ref 11.6–15.2)

## 2016-01-12 LAB — POC OCCULT BLOOD, ED: Fecal Occult Bld: NEGATIVE

## 2016-01-12 LAB — APTT: aPTT: 72 seconds — ABNORMAL HIGH (ref 24–37)

## 2016-01-12 LAB — LACTIC ACID, PLASMA
LACTIC ACID, VENOUS: 0.9 mmol/L (ref 0.5–2.0)
Lactic Acid, Venous: 1.2 mmol/L (ref 0.5–2.0)

## 2016-01-12 LAB — MRSA PCR SCREENING: MRSA by PCR: NEGATIVE

## 2016-01-12 LAB — PROCALCITONIN: PROCALCITONIN: 0.53 ng/mL

## 2016-01-12 LAB — BRAIN NATRIURETIC PEPTIDE: B Natriuretic Peptide: 180.2 pg/mL — ABNORMAL HIGH (ref 0.0–100.0)

## 2016-01-12 MED ORDER — AMLODIPINE BESYLATE 10 MG PO TABS
10.0000 mg | ORAL_TABLET | Freq: Every day | ORAL | Status: DC
  Administered 2016-01-12 – 2016-01-14 (×3): 10 mg via ORAL
  Filled 2016-01-12 (×3): qty 1

## 2016-01-12 MED ORDER — MUSCLE RUB 10-15 % EX CREA
1.0000 "application " | TOPICAL_CREAM | Freq: Two times a day (BID) | CUTANEOUS | Status: DC
  Administered 2016-01-12 – 2016-01-14 (×5): 1 via TOPICAL
  Filled 2016-01-12: qty 85

## 2016-01-12 MED ORDER — FENTANYL CITRATE (PF) 100 MCG/2ML IJ SOLN
50.0000 ug | Freq: Once | INTRAMUSCULAR | Status: AC
  Administered 2016-01-12: 50 ug via INTRAVENOUS
  Filled 2016-01-12: qty 2

## 2016-01-12 MED ORDER — ACETAMINOPHEN 325 MG PO TABS
650.0000 mg | ORAL_TABLET | Freq: Four times a day (QID) | ORAL | Status: DC | PRN
  Administered 2016-01-13: 650 mg via ORAL
  Filled 2016-01-12: qty 2

## 2016-01-12 MED ORDER — METRONIDAZOLE IN NACL 5-0.79 MG/ML-% IV SOLN
500.0000 mg | Freq: Three times a day (TID) | INTRAVENOUS | Status: DC
  Administered 2016-01-12 – 2016-01-13 (×4): 500 mg via INTRAVENOUS
  Filled 2016-01-12 (×5): qty 100

## 2016-01-12 MED ORDER — POLYSACCHARIDE IRON COMPLEX 150 MG PO CAPS
150.0000 mg | ORAL_CAPSULE | Freq: Two times a day (BID) | ORAL | Status: DC
Start: 2016-01-12 — End: 2016-01-14
  Administered 2016-01-12 – 2016-01-14 (×5): 150 mg via ORAL
  Filled 2016-01-12 (×5): qty 1

## 2016-01-12 MED ORDER — VITAMIN D 1000 UNITS PO TABS
2000.0000 [IU] | ORAL_TABLET | Freq: Every day | ORAL | Status: DC
  Administered 2016-01-12 – 2016-01-14 (×3): 2000 [IU] via ORAL
  Filled 2016-01-12 (×3): qty 2

## 2016-01-12 MED ORDER — METOPROLOL TARTRATE 25 MG PO TABS
12.5000 mg | ORAL_TABLET | Freq: Two times a day (BID) | ORAL | Status: DC
  Administered 2016-01-12 – 2016-01-14 (×5): 12.5 mg via ORAL
  Filled 2016-01-12 (×5): qty 1

## 2016-01-12 MED ORDER — LATANOPROST 0.005 % OP SOLN
1.0000 [drp] | Freq: Every day | OPHTHALMIC | Status: DC
  Administered 2016-01-12 – 2016-01-13 (×2): 1 [drp] via OPHTHALMIC
  Filled 2016-01-12: qty 2.5

## 2016-01-12 MED ORDER — ATORVASTATIN CALCIUM 10 MG PO TABS
10.0000 mg | ORAL_TABLET | Freq: Every day | ORAL | Status: DC
  Administered 2016-01-12 – 2016-01-13 (×2): 10 mg via ORAL
  Filled 2016-01-12 (×4): qty 1

## 2016-01-12 MED ORDER — INSULIN ASPART 100 UNIT/ML ~~LOC~~ SOLN
0.0000 [IU] | Freq: Three times a day (TID) | SUBCUTANEOUS | Status: DC
  Administered 2016-01-13: 1 [IU] via SUBCUTANEOUS

## 2016-01-12 MED ORDER — METRONIDAZOLE IN NACL 5-0.79 MG/ML-% IV SOLN
500.0000 mg | Freq: Once | INTRAVENOUS | Status: AC
  Administered 2016-01-12: 500 mg via INTRAVENOUS
  Filled 2016-01-12: qty 100

## 2016-01-12 MED ORDER — INSULIN GLARGINE 100 UNIT/ML ~~LOC~~ SOLN
6.0000 [IU] | Freq: Every day | SUBCUTANEOUS | Status: DC
  Administered 2016-01-12 – 2016-01-13 (×2): 6 [IU] via SUBCUTANEOUS
  Filled 2016-01-12 (×3): qty 0.06

## 2016-01-12 MED ORDER — CIPROFLOXACIN IN D5W 400 MG/200ML IV SOLN
400.0000 mg | INTRAVENOUS | Status: DC
  Administered 2016-01-12: 400 mg via INTRAVENOUS
  Filled 2016-01-12: qty 200

## 2016-01-12 MED ORDER — RIVASTIGMINE TARTRATE 3 MG PO CAPS
6.0000 mg | ORAL_CAPSULE | Freq: Two times a day (BID) | ORAL | Status: DC
  Administered 2016-01-12 – 2016-01-14 (×5): 6 mg via ORAL
  Filled 2016-01-12 (×5): qty 2

## 2016-01-12 MED ORDER — WARFARIN - PHARMACIST DOSING INPATIENT: Freq: Every day | Status: DC

## 2016-01-12 MED ORDER — OXYBUTYNIN CHLORIDE ER 10 MG PO TB24
10.0000 mg | ORAL_TABLET | Freq: Every day | ORAL | Status: DC
  Administered 2016-01-12 – 2016-01-14 (×3): 10 mg via ORAL
  Filled 2016-01-12 (×3): qty 1

## 2016-01-12 MED ORDER — ACETAMINOPHEN 650 MG RE SUPP: 650.0000 mg | Freq: Four times a day (QID) | RECTAL | Status: DC | PRN

## 2016-01-12 MED ORDER — DULOXETINE HCL 30 MG PO CPEP
30.0000 mg | ORAL_CAPSULE | Freq: Every day | ORAL | Status: DC
  Administered 2016-01-12 – 2016-01-14 (×3): 30 mg via ORAL
  Filled 2016-01-12 (×3): qty 1

## 2016-01-12 MED ORDER — MORPHINE SULFATE (PF) 2 MG/ML IV SOLN
2.0000 mg | INTRAVENOUS | Status: DC | PRN
  Administered 2016-01-12: 2 mg via INTRAVENOUS
  Filled 2016-01-12: qty 1

## 2016-01-12 MED ORDER — FAMOTIDINE IN NACL 20-0.9 MG/50ML-% IV SOLN
20.0000 mg | Freq: Two times a day (BID) | INTRAVENOUS | Status: DC
  Administered 2016-01-12 (×2): 20 mg via INTRAVENOUS
  Filled 2016-01-12 (×3): qty 50

## 2016-01-12 MED ORDER — SODIUM CHLORIDE 0.9% FLUSH
3.0000 mL | Freq: Two times a day (BID) | INTRAVENOUS | Status: DC
  Administered 2016-01-12 – 2016-01-14 (×4): 3 mL via INTRAVENOUS

## 2016-01-12 MED ORDER — CALCITRIOL 0.25 MCG PO CAPS
0.2500 ug | ORAL_CAPSULE | ORAL | Status: DC
  Administered 2016-01-12 – 2016-01-14 (×2): 0.25 ug via ORAL
  Filled 2016-01-12 (×2): qty 1

## 2016-01-12 MED ORDER — CIPROFLOXACIN IN D5W 400 MG/200ML IV SOLN
400.0000 mg | Freq: Once | INTRAVENOUS | Status: AC
  Administered 2016-01-12: 400 mg via INTRAVENOUS
  Filled 2016-01-12: qty 200

## 2016-01-12 MED ORDER — POLYVINYL ALCOHOL 1.4 % OP SOLN
1.0000 [drp] | Freq: Three times a day (TID) | OPHTHALMIC | Status: DC
  Administered 2016-01-12 – 2016-01-14 (×6): 1 [drp] via OPHTHALMIC
  Filled 2016-01-12 (×2): qty 15

## 2016-01-12 MED ORDER — LISINOPRIL 40 MG PO TABS
40.0000 mg | ORAL_TABLET | Freq: Every day | ORAL | Status: DC
  Administered 2016-01-12 – 2016-01-14 (×3): 40 mg via ORAL
  Filled 2016-01-12 (×4): qty 1

## 2016-01-12 MED ORDER — ONDANSETRON HCL 4 MG PO TABS: 4.0000 mg | ORAL_TABLET | Freq: Four times a day (QID) | ORAL | Status: DC | PRN

## 2016-01-12 MED ORDER — ONDANSETRON HCL 4 MG/2ML IJ SOLN: 4.0000 mg | Freq: Four times a day (QID) | INTRAMUSCULAR | Status: DC | PRN

## 2016-01-12 MED ORDER — SODIUM CHLORIDE 0.9 % IV BOLUS (SEPSIS)
1500.0000 mL | Freq: Once | INTRAVENOUS | Status: AC
  Administered 2016-01-12: 1500 mL via INTRAVENOUS

## 2016-01-12 NOTE — Clinical Social Work Note (Signed)
Clinical Social Work Assessment  Patient Details  Name: Bianca Wyatt MRN: [id number] Date of Birth: 07-04-1934  Date of referral:  01/12/16               Reason for consult:  Other (Comment Required) (Admitted from facility, [facility])                Permission sought to share information with:    Permission granted to share information::     Name::        Agency::     Relationship::     Contact Information:     Housing/Transportation Living arrangements for the past 2 months:  Skilled Nursing Facility (Patient from [facility]) Source of Information:  Patient Patient Interpreter Needed:  None Criminal Activity/Legal Involvement Pertinent to Current Situation/Hospitalization:  No - Comment as needed Significant Relationships:  Other(Comment) (Patient reports she has church family that make visits, patient reports she does not talk to her family often) Lives with:  Facility Resident Do you feel safe going back to the place where you live?    Need for family participation in patient care:   (Unknown)  Care giving concerns: Unknown at this time   Office manager / plan: CSW spoke with patient at bedside with nurse present. Patient has history of dementia, per MD note. Patient reports she presents to [facility] due to bleeding from her rectum. Patient reports she has been at [facility] for ten years and she likes being at this facility. Patient reports she has family in [place], however, she does not talk to them often. Patient reports she has church family that visit her , speak with her by phone, and they pick her up for church services.   Patient reports she uses a wheelchair and a walker. Patient reports she bath and dress herself.   Employment status:    Insurance information:  Medicare PT Recommendations:  Not assessed at this time Information / Referral to community resources:  Other (Comment Required) (None given at this time)  Patient/Family's Response to care:  Unknown at this time  Patient/Family's Understanding of and Emotional Response to Diagnosis, Current Treatment, and Prognosis: Unknown at this time  Emotional Assessment Appearance:  Appears stated age Attitude/Demeanor/Rapport:   (Patient was pleasant) Affect (typically observed):  Pleasant Orientation:    Alcohol / Substance use:  Not Applicable Psych involvement (Current and /or in the community):  No (Comment)  Discharge Needs  Concerns to be addressed:  Other (Comment Required (Unknown at this time) Readmission within the last 30 days:    Current discharge risk:    Barriers to Discharge:      Claudean Severance, LCSW 01/12/2016, 1:47 PM

## 2016-01-12 NOTE — ED Notes (Signed)
MD at bedside. EDP KNOTT PRESENT EVALUATING PT

## 2016-01-12 NOTE — ED Notes (Signed)
Pt states she had a black pocket book with gold circles with her upon arrival to [facility].  Luetta Nutting RN  and this Clinical research associate assessed the room upon her transfer and did not see find.

## 2016-01-12 NOTE — ED Notes (Signed)
Pt can go up at 15:35

## 2016-01-12 NOTE — H&P (Addendum)
[facility] History and Physical  Bianca Wyatt ZOX:[id number] DOB: 08-19-1934 DOA: 01/11/2016  Referring physician: ED physician PCP: Angela Cox, MD  Specialists:   Chief Complaint: Diarrhea, rectal bleeding and abdominal pain  HPI: Bianca Wyatt is a 82 y.o. female with PMH of hypertension, diabetes mellitus, GERD, depression, dementia, seizure, diastolic congestive heart failure, pulmonary embolism on Coumadin, dementia, chronic kidney disease-stage III, who presents with diarrhea, rectal bleeding and abdominal pain.  Patient reports that she has been having rectal bleeding, diarrhea and abdominal pain in the past 4 days intermittently. She has 1-2 bloody stool per day. She also have moderate abdominal pain, which is located and periumbilical area, constant, nonradiating. Patient has mild nausea, but no vomiting, fever or chills. No symptoms of UTI. Patient does not have chest pain, shortness breath, cough, unilateral weakness.  In ED, patient was found to have WBC 20.9, negative FOBT, stable hemoglobin 9.2 on 01/20/11--> 10.0 today, temperature normal, tachypnea, INR 4.05, PTT 72, stable renal function. CT abdomen/pelvis showed segmental colitis. Patient admitted to inpatient for further evaluation and treatment.  EKG:  Not done in ED, will get one.   Where does patient live?  SNF  Can patient participate in ADLs?    None   Review of Systems:   General: no fevers, chills, no changes in body weight, has poor appetite, has fatigue HEENT: no blurry vision, hearing changes or sore throat Pulm: no dyspnea, coughing, wheezing CV: no chest pain, no palpitations Abd: has nausea, abdominal pain, diarrhea. No vomiting or constipation GU: no dysuria, burning on urination, increased urinary frequency, hematuria  Ext: no leg edema Neuro: no unilateral weakness, numbness, or tingling, no vision change or hearing loss Skin: no rash MSK: No muscle spasm, no deformity, no  limitation of range of movement in spin Heme: No easy bruising.  Travel history: No recent long distant travel.  Allergy: No Known Allergies  Past Medical History  Diagnosis Date  . Diabetes mellitus without complication (HCC)   . Hypertension   . Anemia   . Allergy   . Arthritis   . Seizures (HCC)   . Syncope   . Chronic diastolic (congestive) heart failure [facility])     Past Surgical History  Procedure Laterality Date  . Exploratory lap for sbo  2012    Social History:  reports that she has never smoked. She does not have any smokeless tobacco history on file. Her alcohol and drug histories are not on file.  Family History:  Family History  Problem Relation Age of Onset  . Heart disease Mother   . Heart disease Father      Prior to Admission medications   Medication Sig Start Date End Date Taking? Authorizing Provider  amLODipine (NORVASC) 10 MG tablet Take 10 mg by mouth daily.   Yes Historical Provider, MD  atorvastatin (LIPITOR) 10 MG tablet Take 10 mg by mouth daily.    Yes Historical Provider, MD  calcitRIOL (ROCALTROL) 0.25 MCG capsule Take 0.25 mcg by mouth 3 (three) times a week. Mon, Wed, Fri. 01/11/16  Yes Historical Provider, MD  Cholecalciferol (VITAMIN D) 2000 units tablet Take 2,000 Units by mouth daily.   Yes Historical Provider, MD  DULoxetine (CYMBALTA) 30 MG capsule Take 30 mg by mouth daily.  01/10/16  Yes Historical Provider, MD  furosemide (LASIX) 20 MG tablet Take 10 mg by mouth daily.  01/03/16  Yes Historical Provider, MD  HYDROcodone-acetaminophen (NORCO/VICODIN) 5-325 MG per tablet Take 1 tablet by mouth every  4 (four) hours as needed for pain. Patient not taking: Reported on 01/11/2016 06/11/13   Oneal Grout, MD  insulin aspart (NOVOLOG) 100 UNIT/ML injection Inject 0-10 Units into the skin 3 (three) times daily as needed for high blood sugar. Sliding Scale. 200-250=2 units, 251-300=4 units, 301-350 units=6 units, 351-400=8 units, 400=Call MD.     Historical Provider, MD  insulin glargine (LANTUS) 100 UNIT/ML injection Inject 11 Units into the skin at bedtime.   Yes Historical Provider, MD  iron polysaccharides (NIFEREX) 150 MG capsule Take 150 mg by mouth 2 (two) times daily.   Yes Historical Provider, MD  latanoprost (XALATAN) 0.005 % ophthalmic solution Place 1 drop into both eyes at bedtime.  12/29/15  Yes Historical Provider, MD  lisinopril (PRINIVIL,ZESTRIL) 40 MG tablet Take 40 mg by mouth daily.   Yes Historical Provider, MD  Menthol, Topical Analgesic, (BIOFREEZE) 4 % GEL Apply 1 application topically 2 (two) times daily. To back and knees   Yes Historical Provider, MD  metoprolol tartrate (LOPRESSOR) 25 MG tablet Take 12.5 mg by mouth 2 (two) times daily.  01/05/16  Yes Historical Provider, MD  omeprazole (PRILOSEC) 20 MG capsule Take 20 mg by mouth 2 (two) times daily before a meal.   Yes Historical Provider, MD  oxybutynin (DITROPAN-XL) 10 MG 24 hr tablet Take 10 mg by mouth daily.    Yes Historical Provider, MD  Polyvinyl Alcohol-Povidone (FRESHKOTE) 2.7-2 % SOLN Apply 1 drop to eye 3 (three) times daily.   Yes Historical Provider, MD  rivastigmine (EXELON) 6 MG capsule Take 6 mg by mouth 2 (two) times daily.   Yes Historical Provider, MD  warfarin (COUMADIN) 3 MG tablet Take 3 mg by mouth daily.   Yes Historical Provider, MD    Physical Exam: Filed Vitals:   01/12/16 0330 01/12/16 0400 01/12/16 0457 01/12/16 0608  BP: 127/69 140/74  109/63  Pulse: 75 91  80  Temp:      TempSrc:      Resp: 21 26  15   Height:   5\' 2"  (1.575 m)   Weight:   89.812 kg (198 lb)   SpO2: 100% 100%  97%   General: Not in acute distress HEENT:       Eyes: PERRL, EOMI, no scleral icterus.       ENT: No discharge from the ears and nose, no pharynx injection, no tonsillar enlargement.        Neck: No JVD, no bruit, no mass felt. Heme: No neck lymph node enlargement. Cardiac: S1/S2, RRR, No murmurs, No gallops or rubs. Pulm: No rales, wheezing,  rhonchi or rubs. Abd: Soft, nondistended, tenderness over periumbilical area, no rebound pain, no organomegaly, BS present. Ext: No pitting leg edema bilaterally. 2+DP/PT pulse bilaterally. Musculoskeletal: No joint deformities, No joint redness or warmth, no limitation of ROM in spin. Skin: No rashes.  Neuro: Alert, oriented X3, cranial nerves II-XII grossly intact, moves all extremities normally. Psych: Patient is not psychotic, no suicidal or hemocidal ideation.  Labs on Admission:  Basic Metabolic Panel:  Recent Labs Lab 01/11/16 2348 01/12/16 0429  NA 145 143  K 3.9 3.4*  CL 116* 116*  CO2 19* 18*  GLUCOSE 118* 113*  BUN 30* 27*  CREATININE 1.99* 1.84*  CALCIUM 9.4 8.7*   Liver Function Tests:  Recent Labs Lab 01/11/16 2348  AST 21  ALT 20  ALKPHOS 75  BILITOT 0.7  PROT 8.1  ALBUMIN 3.6   No results for input(s): LIPASE, AMYLASE in  the last 168 hours. No results for input(s): AMMONIA in the last 168 hours. CBC:  Recent Labs Lab 01/11/16 2348 01/12/16 0429  WBC 20.9* 20.5*  NEUTROABS 17.9*  --   HGB 10.0* 8.7*  HCT 32.5* 27.4*  MCV 88.1 87.5  PLT 312 294   Cardiac Enzymes: No results for input(s): CKTOTAL, CKMB, CKMBINDEX, TROPONINI in the last 168 hours.  BNP (last 3 results)  Recent Labs  01/12/16 0429  BNP 180.2*    ProBNP (last 3 results) No results for input(s): PROBNP in the last 8760 hours.  CBG: No results for input(s): GLUCAP in the last 168 hours.  Radiological Exams on Admission: Ct Abdomen Pelvis Wo Contrast  01/12/2016  CLINICAL DATA:  Acute onset of intermittent rectal bleeding and upper abdominal pain. Nausea and leukocytosis. Initial encounter. EXAM: CT ABDOMEN AND PELVIS WITHOUT CONTRAST TECHNIQUE: Multidetector CT imaging of the abdomen and pelvis was performed following the standard protocol without IV contrast. COMPARISON:  CT of the abdomen and pelvis from 10/18/2010, and renal ultrasound performed 01/14/2011 FINDINGS: The  visualized lung bases are clear. Scattered coronary artery calcification is noted. Mitral valve calcification is noted. The liver and spleen are unremarkable in appearance. Stones are seen dependently within the gallbladder. The gallbladder is otherwise unremarkable. The pancreas and adrenal glands are unremarkable. The kidneys are unremarkable in appearance. There is no evidence of hydronephrosis. No renal or ureteral stones are seen. No perinephric stranding is appreciated. No free fluid is identified. The small bowel is unremarkable in appearance. The stomach is within normal limits. No acute vascular abnormalities are seen. Scattered calcification is noted along the abdominal aorta and its branches. Diffuse wall thickening is noted along the transverse and descending colon, with associated soft tissue inflammation, compatible with segmental colitis, either infectious or inflammatory in nature. The appendix is not definitely characterized; there is no evidence of appendicitis. Scattered diverticulosis is noted along the descending and sigmoid colon, without evidence of diverticulitis. The bladder is moderately distended and grossly unremarkable. The patient appears status post hysterectomy. A small amount of free fluid is seen within the pelvis. No suspicious adnexal masses are seen. No inguinal lymphadenopathy is seen. No acute osseous abnormalities are identified. There is mild grade 1 anterolisthesis of L4 on L5, reflecting underlying facet disease. IMPRESSION: 1. Diffuse wall thickening along the transverse and descending colon, with associated soft tissue inflammation, compatible with segmental colitis, either infectious or inflammatory in nature. 2. Small amount of free fluid within the pelvis may reflect the colitis. 3. Cholelithiasis.  Gallbladder otherwise unremarkable. 4. Scattered calcification along the abdominal aorta and its branches. 5. Scattered coronary artery calcification noted. Mitral valve  calcification seen. 6. Scattered diverticulosis along the descending and sigmoid colon, without evidence of diverticulitis. Electronically Signed   By: Roanna Raider M.D.   On: 01/12/2016 01:55    Assessment/Plan Principal Problem:   Colitis Active Problems:   Pure hypercholesterolemia   Esophageal reflux   Pulmonary embolism (HCC)   Dementia without behavioral disturbance   Depression   Diabetes (HCC)   Sepsis (HCC)   Rectal bleeding   HLD (hyperlipidemia)   CKD (chronic kidney disease), stage III   Colitis and mild sepsis due to colitis: pt has negative FOBT, stable hemoglobin 9.2 on 01/20/11--> 10.0 today. Patient meets criteria for sepsis with leukocytosis and tachypnea. Lactate 1.2. Hemodynamically stable on admission. - Will admit to tele bed due to hx of CHF and PE -IV Cipro and Flagyl -When necessary Zofran for Nausea  and morphine for pain -will get Procalcitonin and trend lactic acid levels per sepsis protocol. -IVF: 2L of NS bolus in ED, followed by 75 cc/h  -check c diff pcr  Rectal bleeding: most likely from colitis, which was worsened by supratherapeutic INR secondary to Coumadin use. Hemoglobin stable. Hemodynamically stable. Pt does not have EGD or colonoscopy on record. - f/u by cbc q12h, Transfuse blood when needed - INR/PTT/type & screen  HLD: Last LDL was 94 on 01/15/11 -Continue home medications: Lipitor  DM-II: Last A1c 6.2 on 01/14/11, well controled. Patient is takingLantus at home -will decrease Lantus dose from 11 units to 6 units daily  -SSI  GERD: -will switch PPI to pepcid IV until C diff pcr negative  Pulmonary embolism [facility]): patient is on Coumadin. INR 4.05. Though patient has rectal bleeding, hemoglobin stable. Will continue Coumadin -will consult pharmacy for continuation of Coumadin  Dementia without behavioral disturbance: -continue Exelon  Depression and anxiety: Stable, no suicidal or homicidal ideations. -Continue home medications:  Cymbalta  Chronic diastolic congestive heart failure: 2 echo on 09/27/10 showed EF 60% with grade 1 diastolic dysfunction. Patient is on low-dose Lasix, 10 mg daily at home. No leg edema or JVD. CHF compensated. -Hold Lasix due to sepsis. -continue metoprolol -Check BNP  Hypertension:  -Continue metoprolol, lisinopril and amlodipine  CKD-III: stable. Baseline creatinine 1.9-2.3. Her creatinine is 1.99 on admission. Follow-up renal function to BMP   DVT ppx: on Coumadin  Code Status: Full code Family Communication: None at bed side.  Disposition Plan: Admit to inpatient   Date of Service 01/12/2016    Lorretta Harp [facility] Pager [phone]  If 7PM-7AM, please contact night-coverage [email or web address] Password [facility] 01/12/2016, 6:24 AM

## 2016-01-12 NOTE — Progress Notes (Signed)
ANTICOAGULATION CONSULT NOTE - Initial Consult  Pharmacy Consult for Warfarin Indication: PE  No Known Allergies  Patient Measurements: Height:  (157.5 cm) Weight: 198 lb (89.812 kg) IBW/kg (Calculated) : 50.1 Heparin Dosing Weight:   Vital Signs: Temp: 98.5 F (36.9 C) (02/21 2151) Temp Source: Oral (02/21 2151) BP: 127/63 mmHg (02/22 0630) Pulse Rate: 71 (02/22 0630)  Labs:  Recent Labs  01/11/16 2348 01/12/16 0429 01/12/16 0502  HGB 10.0* 8.7*  --   HCT 32.5* 27.4*  --   PLT 312 294  --   APTT 72*  --   --   LABPROT 37.2*  --  41.1*  INR 4.05*  --  4.62*  CREATININE 1.99* 1.84*  --     Estimated Creatinine Clearance: 25.8 mL/min (by C-G formula based on Cr of 1.84).   Medical History: Past Medical History  Diagnosis Date  . Diabetes mellitus without complication (HCC)   . Hypertension   . Anemia   . Allergy   . Arthritis   . Seizures (HCC)   . Syncope   . Chronic diastolic (congestive) heart failure (HCC)   . PE (pulmonary embolism)   . CKD (chronic kidney disease), stage III   . Chronic diastolic (congestive) heart failure (HCC)   . Dementia     Medications:   (Not in a hospital admission)  Assessment: On admit INR 4.05, 2/22 AM labs now 4.62.   Pt in ED with diarrhea, rectal bleeding, abdom pain.  PMx of DM, dementia, seizure, CHF, PE on warfarin, CKD-3.  MD notes colitis and wants pharmacy to dose cipro.  MD also wants warfarin continued when appropriate.  Goal of Therapy:  INR 2-3    Plan:  No warfarin at this time. Daily INR  Darlina Guys, Jacquenette Shone Crowford 01/12/2016,6:58 AM

## 2016-01-12 NOTE — ED Notes (Signed)
MD at bedside. 

## 2016-01-12 NOTE — ED Notes (Signed)
Pt transported to CT ?

## 2016-01-12 NOTE — ED Notes (Addendum)
MD made aware that pt is requesting pain medicine; orders to follow

## 2016-01-12 NOTE — Progress Notes (Signed)
Pharmacy Antibiotic Note  Bianca Wyatt is a 82 y.o. female admitted on 01/11/2016 with Colitis.  Pharmacy has been consulted for Ciprofloxacin dosing.  First dose of antibiotics already given in ED.  Patient with poor renal function, < 16mL/min.    Plan: Ciprofloxacin  iv q24hr  Height:  (157.5 cm) Weight: 198 lb (89.812 kg) IBW/kg (Calculated) : 50.1  Temp (24hrs), Avg:98.5 F (36.9 C), Min:98.5 F (36.9 C), Max:98.5 F (36.9 C)   Recent Labs Lab 01/11/16 2348 01/12/16 0429  WBC 20.9* 20.5*  CREATININE 1.99* 1.84*  LATICACIDVEN  --  1.2    Estimated Creatinine Clearance: 25.8 mL/min (by C-G formula based on Cr of 1.84).    No Known Allergies  Antimicrobials this admission: Ciprofloxacin 2/22 >>  Flagyl 2/22 >>    Thank you for allowing pharmacy to be a part of this patient's care.  Aleene Davidson Crowford 01/12/2016 6:56 AM

## 2016-01-12 NOTE — Progress Notes (Signed)
Patient admitted after midnight, please see H&P.  Awaiting bed.  Will start clear diet.  Continue IV abx.  PT eval  Marlin Canary DO

## 2016-01-13 DIAGNOSIS — N183 Chronic kidney disease, stage 3 (moderate): Secondary | ICD-10-CM

## 2016-01-13 LAB — CBC
HCT: 29 % — ABNORMAL LOW (ref 36.0–46.0)
HEMATOCRIT: 29.3 % — AB (ref 36.0–46.0)
HEMOGLOBIN: 9.1 g/dL — AB (ref 12.0–15.0)
Hemoglobin: 9.3 g/dL — ABNORMAL LOW (ref 12.0–15.0)
MCH: 27.1 pg (ref 26.0–34.0)
MCH: 27.9 pg (ref 26.0–34.0)
MCHC: 31.1 g/dL (ref 30.0–36.0)
MCHC: 32.1 g/dL (ref 30.0–36.0)
MCV: 87.2 fL (ref 78.0–100.0)
MCV: 87.1 fL (ref 78.0–100.0)
PLATELETS: 298 10*3/uL (ref 150–400)
Platelets: 287 10*3/uL (ref 150–400)
RBC: 3.36 MIL/uL — AB (ref 3.87–5.11)
RBC: 3.33 MIL/uL — ABNORMAL LOW (ref 3.87–5.11)
RDW: 15.8 % — ABNORMAL HIGH (ref 11.5–15.5)
RDW: 15.6 % — AB (ref 11.5–15.5)
WBC: 14.3 10*3/uL — ABNORMAL HIGH (ref 4.0–10.5)
WBC: 11.3 10*3/uL — AB (ref 4.0–10.5)

## 2016-01-13 LAB — GLUCOSE, CAPILLARY
GLUCOSE-CAPILLARY: 86 mg/dL (ref 65–99)
GLUCOSE-CAPILLARY: 134 mg/dL — AB (ref 65–99)
Glucose-Capillary: 76 mg/dL (ref 65–99)
Glucose-Capillary: 102 mg/dL — ABNORMAL HIGH (ref 65–99)

## 2016-01-13 LAB — BASIC METABOLIC PANEL
ANION GAP: 9 (ref 5–15)
BUN: 24 mg/dL — ABNORMAL HIGH (ref 6–20)
CALCIUM: 8.9 mg/dL (ref 8.9–10.3)
CO2: 19 mmol/L — ABNORMAL LOW (ref 22–32)
Chloride: 115 mmol/L — ABNORMAL HIGH (ref 101–111)
Creatinine, Ser: 1.79 mg/dL — ABNORMAL HIGH (ref 0.44–1.00)
GFR, EST AFRICAN AMERICAN: 30 mL/min — AB (ref >60)
GFR, EST NON AFRICAN AMERICAN: 26 mL/min — AB (ref >60)
Glucose, Bld: 97 mg/dL (ref 65–99)
Potassium: 3.7 mmol/L (ref 3.5–5.1)
Sodium: 143 mmol/L (ref 135–145)

## 2016-01-13 LAB — PROTIME-INR
INR: 4.66 — AB (ref 0.00–1.49)
PROTHROMBIN TIME: 42.6 s — AB (ref 11.6–15.2)

## 2016-01-13 MED ORDER — PANTOPRAZOLE SODIUM 40 MG PO TBEC
40.0000 mg | DELAYED_RELEASE_TABLET | Freq: Every day | ORAL | Status: DC
  Administered 2016-01-13 – 2016-01-14 (×2): 40 mg via ORAL
  Filled 2016-01-13 (×2): qty 1

## 2016-01-13 MED ORDER — FUROSEMIDE 20 MG PO TABS
10.0000 mg | ORAL_TABLET | Freq: Every day | ORAL | Status: DC
  Administered 2016-01-13 – 2016-01-14 (×2): 10 mg via ORAL
  Filled 2016-01-13 (×2): qty 1

## 2016-01-13 MED ORDER — CIPROFLOXACIN HCL 500 MG PO TABS
500.0000 mg | ORAL_TABLET | Freq: Every day | ORAL | Status: DC
  Administered 2016-01-13: 500 mg via ORAL
  Filled 2016-01-13: qty 1

## 2016-01-13 MED ORDER — METRONIDAZOLE 500 MG PO TABS
500.0000 mg | ORAL_TABLET | Freq: Three times a day (TID) | ORAL | Status: DC
  Administered 2016-01-13 – 2016-01-14 (×4): 500 mg via ORAL
  Filled 2016-01-13 (×4): qty 1

## 2016-01-13 NOTE — Clinical Documentation Improvement (Signed)
Internal Medicine  Can the diagnosis of anemia be further specified? Please document response in next progress note NOT in BPA drop down box.   Iron deficiency Anemia  Nutritional anemia, including the nutrition or mineral deficits  Chronic Blood Loss Anemia, including the suspected or known cause  Anemia of chronic disease, including the associated chronic disease state  Other  Clinically Undetermined  Document any associated diagnoses/conditions.  Supporting Information:  H&H's running from 10/32 to 8.7/27 to 9/29 while in-house  Please exercise your independent, professional judgment when responding. A specific answer is not anticipated or expected.  Thank You,  Shellee Milo RN, BSN, CCDS Health Information Management [place] [phone]; Cell: [phone]

## 2016-01-13 NOTE — Progress Notes (Signed)
OT Cancellation Note  Patient Details Name: Bianca Wyatt MRN: [id number] DOB: [birth year]   Cancelled Treatment:    Reason Eval/Treat Not Completed: OT screened, no needs identified, will sign off -- Patient from SNF where she is a long term resident. Will defer any OT needs to SNF.  Larrie Lucia A 01/13/2016, 9:59 AM

## 2016-01-13 NOTE — Progress Notes (Signed)
PROGRESS NOTE  Bianca Wyatt NGE:[id number] DOB: 1934/08/14 DOA: 01/11/2016 PCP: Angela Cox, MD  Assessment/Plan: Colitis and mild sepsis due to colitis: pt has negative FOBT, stable hemoglobin 9.2 on 01/20/11--> 10.0 today Lactate 1. -IV Cipro and Flagyl- change to PO today -When necessary Zofran for Nause -c diff pcr negative  Rectal bleeding: most likely from colitis, which was worsened by supratherapeutic INR secondary to Coumadin use.  -Hemoglobin stable.  -Pt does not have EGD or colonoscopy on record- follow up outpatient -resolved  HLD: Last LDL was 94 on 01/15/11 -Continue home medications: Lipitor  DM-II: Last A1c 6.2 on 01/14/11, well controled. Patient is taking Lantus at home -will decrease Lantus dose from 11 units to 6 units daily  -SSI  GERD: -PPI  Pulmonary embolism [facility]): patient is on Coumadin. INR 4.05. Though patient has rectal bleeding, hemoglobin stable. Will continue Coumadin -will consult pharmacy for continuation of Coumadin  Dementia without behavioral disturbance: -continue Exelon  Depression and anxiety: Stable, no suicidal or homicidal ideations. -Continue home medications: Cymbalta  Chronic diastolic congestive heart failure: 2D echo on 09/27/10 showed EF 60% with grade 1 diastolic dysfunction. Patient is on low-dose Lasix, 10 mg daily at home. No leg edema or JVD. CHF compensated. -continue metoprolol -resume lasix  Hypertension:  -Continue metoprolol, lisinopril and amlodipine  CKD-III: stable. Baseline creatinine 1.9-2.3.    Code Status: full Family Communication: patient Disposition Plan: back to SNF in AM   Consultants:    Procedures:    HPI/Subjective: Hungry-- no further bleeding, would like more food  Objective: Filed Vitals:   01/12/16 2127 01/13/16 0442  BP: 130/60 139/65  Pulse: 75 84  Temp: 98 F (36.7 C) 98.4 F (36.9 C)  Resp: 20 18    Intake/Output Summary (Last 24 hours) at 01/13/16  0817 Last data filed at 01/13/16 4010  Gross per 24 hour  Intake   1250 ml  Output      0 ml  Net   1250 ml   Filed Weights   01/12/16 0457 01/13/16 0442  Weight: 89.812 kg (198 lb) 90.1 kg (198 lb 10.2 oz)    Exam:   General:  Awake, NAD- pleasant  Cardiovascular: rrr  Respiratory: clear  Abdomen: +BS, soft  Musculoskeletal: no edema   Data Reviewed: Basic Metabolic Panel:  Recent Labs Lab 01/11/16 2348 01/12/16 0429 01/13/16 0458  NA 145 143 143  K 3.9 3.4* 3.7  CL 116* 116* 115*  CO2 19* 18* 19*  GLUCOSE 118* 113* 97  BUN 30* 27* 24*  CREATININE 1.99* 1.84* 1.79*  CALCIUM 9.4 8.7* 8.9   Liver Function Tests:  Recent Labs Lab 01/11/16 2348  AST 21  ALT 20  ALKPHOS 75  BILITOT 0.7  PROT 8.1  ALBUMIN 3.6   No results for input(s): LIPASE, AMYLASE in the last 168 hours. No results for input(s): AMMONIA in the last 168 hours. CBC:  Recent Labs Lab 01/11/16 2348 01/12/16 0429 01/12/16 1645 01/13/16 0458  WBC 20.9* 20.5* 19.0* 14.3*  NEUTROABS 17.9*  --   --   --   HGB 10.0* 8.7* 10.0* 9.1*  HCT 32.5* 27.4* 30.6* 29.3*  MCV 88.1 87.5 86.9 87.2  PLT 312 294 273 287   Cardiac Enzymes: No results for input(s): CKTOTAL, CKMB, CKMBINDEX, TROPONINI in the last 168 hours. BNP (last 3 results)  Recent Labs  01/12/16 0429  BNP 180.2*    ProBNP (last 3 results) No results for input(s): PROBNP in the last 8760  hours.  CBG:  Recent Labs Lab 01/12/16 0856 01/12/16 1226 01/12/16 1734 01/12/16 2016 01/13/16 0736  GLUCAP 90 96 79 102* 76    Recent Results (from the past 240 hour(s))  Culture, blood (x 2)     Status: None (Preliminary result)   Collection Time: 01/12/16  4:45 PM  Result Value Ref Range Status   Specimen Description BLOOD RIGHT ARM  Final   Special Requests   Final    BOTTLES DRAWN AEROBIC AND ANAEROBIC 5CC AER,2CC ANA   Culture PENDING  Incomplete   Report Status PENDING  Incomplete  MRSA PCR Screening     Status:  None   Collection Time: 01/12/16  6:10 PM  Result Value Ref Range Status   MRSA by PCR NEGATIVE NEGATIVE Final    Comment:        The GeneXpert MRSA Assay (FDA approved for NASAL specimens only), is one component of a comprehensive MRSA colonization surveillance program. It is not intended to diagnose MRSA infection nor to guide or monitor treatment for MRSA infections.   C difficile quick scan w PCR reflex     Status: None   Collection Time: 01/12/16  9:14 PM  Result Value Ref Range Status   C Diff antigen NEGATIVE NEGATIVE Final   C Diff toxin NEGATIVE NEGATIVE Final   C Diff interpretation Negative for toxigenic C. difficile  Final     Studies: Ct Abdomen Pelvis Wo Contrast  01/12/2016  CLINICAL DATA:  Acute onset of intermittent rectal bleeding and upper abdominal pain. Nausea and leukocytosis. Initial encounter. EXAM: CT ABDOMEN AND PELVIS WITHOUT CONTRAST TECHNIQUE: Multidetector CT imaging of the abdomen and pelvis was performed following the standard protocol without IV contrast. COMPARISON:  CT of the abdomen and pelvis from 10/18/2010, and renal ultrasound performed 01/14/2011 FINDINGS: The visualized lung bases are clear. Scattered coronary artery calcification is noted. Mitral valve calcification is noted. The liver and spleen are unremarkable in appearance. Stones are seen dependently within the gallbladder. The gallbladder is otherwise unremarkable. The pancreas and adrenal glands are unremarkable. The kidneys are unremarkable in appearance. There is no evidence of hydronephrosis. No renal or ureteral stones are seen. No perinephric stranding is appreciated. No free fluid is identified. The small bowel is unremarkable in appearance. The stomach is within normal limits. No acute vascular abnormalities are seen. Scattered calcification is noted along the abdominal aorta and its branches. Diffuse wall thickening is noted along the transverse and descending colon, with associated  soft tissue inflammation, compatible with segmental colitis, either infectious or inflammatory in nature. The appendix is not definitely characterized; there is no evidence of appendicitis. Scattered diverticulosis is noted along the descending and sigmoid colon, without evidence of diverticulitis. The bladder is moderately distended and grossly unremarkable. The patient appears status post hysterectomy. A small amount of free fluid is seen within the pelvis. No suspicious adnexal masses are seen. No inguinal lymphadenopathy is seen. No acute osseous abnormalities are identified. There is mild grade 1 anterolisthesis of L4 on L5, reflecting underlying facet disease. IMPRESSION: 1. Diffuse wall thickening along the transverse and descending colon, with associated soft tissue inflammation, compatible with segmental colitis, either infectious or inflammatory in nature. 2. Small amount of free fluid within the pelvis may reflect the colitis. 3. Cholelithiasis.  Gallbladder otherwise unremarkable. 4. Scattered calcification along the abdominal aorta and its branches. 5. Scattered coronary artery calcification noted. Mitral valve calcification seen. 6. Scattered diverticulosis along the descending and sigmoid colon, without evidence of  diverticulitis. Electronically Signed   By: Roanna Raider M.D.   On: 01/12/2016 01:55    Scheduled Meds: . amLODipine  10 mg Oral Daily  . atorvastatin  10 mg Oral q1800  . calcitRIOL  0.25 mcg Oral Once per day on Mon Wed Fri  . cholecalciferol  2,000 Units Oral Daily  . ciprofloxacin  400 mg Intravenous Q24H  . DULoxetine  30 mg Oral Daily  . famotidine (PEPCID) IV  20 mg Intravenous Q12H  . insulin aspart  0-9 Units Subcutaneous TID WC  . insulin glargine  6 Units Subcutaneous QHS  . iron polysaccharides  150 mg Oral BID  . latanoprost  1 drop Both Eyes QHS  . lisinopril  40 mg Oral Daily  . metoprolol tartrate  12.5 mg Oral BID  . metronidazole  500 mg Intravenous Q8H   . MUSCLE RUB  1 application Topical BID  . oxybutynin  10 mg Oral Daily  . polyvinyl alcohol  1 drop Both Eyes TID  . rivastigmine  6 mg Oral BID  . sodium chloride flush  3 mL Intravenous Q12H  . Warfarin - Pharmacist Dosing Inpatient   Does not apply q1800   Continuous Infusions:  Antibiotics Given (last 72 hours)    Date/Time Action Medication Dose Rate   01/12/16 2143 Given   ciprofloxacin (CIPRO) IVPB 400 mg 400 mg 200 mL/hr      Principal Problem:   Colitis Active Problems:   Pure hypercholesterolemia   Esophageal reflux   Pulmonary embolism (HCC)   Dementia without behavioral disturbance   Depression   Diabetes (HCC)   Sepsis (HCC)   Rectal bleeding   HLD (hyperlipidemia)   CKD (chronic kidney disease), stage III    Time spent: 25 min    Kajuan Guyton [facility]  [facility] Pager [phone]. If 7PM-7AM, please contact night-coverage at [email or web address], password [facility] 01/13/2016, 8:17 AM  LOS: 1 day

## 2016-01-13 NOTE — Care Management Note (Signed)
Case Management Note  Patient Details  Name: Bianca Wyatt MRN: [id number] Date of Birth: 01-Apr-1934  Subjective/Objective: 82 y/o f admitted w/Colitis.From SNF.  CSW following.              Action/Plan:d/c SNF.   Expected Discharge Date:   (UNKNOWN)               Expected Discharge Plan:  Home/Self Care  In-House Referral:     Discharge planning Services  CM Consult  Post Acute Care Choice:    Choice offered to:     DME Arranged:    DME Agency:     HH Arranged:    HH Agency:     Status of Service:  In process, will continue to follow  Medicare Important Message Given:    Date Medicare IM Given:    Medicare IM give by:    Date Additional Medicare IM Given:    Additional Medicare Important Message give by:     If discussed at Long Length of Stay Meetings, dates discussed:    Additional Comments:  Lanier Clam, RN 01/13/2016, 12:57 PM

## 2016-01-13 NOTE — Evaluation (Signed)
Physical Therapy Evaluation Patient Details Name: Bianca Wyatt MRN: [id number] DOB: [birth year] Today's Date: 01/13/2016   History of Present Illness  82 y.o. female with PMH of hypertension, diabetes mellitus, GERD, depression, dementia, seizure, diastolic congestive heart failure, pulmonary embolism on Coumadin, dementia, chronic kidney disease-stage III, who presents with diarrhea, rectal bleeding and abdominal pain and admitted for colitis  Clinical Impression  Pt admitted with above diagnosis. Pt currently with functional limitations due to the deficits listed below (see PT Problem List).  Pt will benefit from skilled PT to increase their independence and safety with mobility to allow discharge to the venue listed below.  Pt with hx of dementia however answers questions appropriately and mobilizing fairly well today.  Pt likely close to her baseline.  Recommend return to facility and will not likely require PT f/u upon d/c.     Follow Up Recommendations No PT follow up (return to facility)    Equipment Recommendations  None recommended by PT    Recommendations for Other Services       Precautions / Restrictions Precautions Precautions: Fall Restrictions Weight Bearing Restrictions: No      Mobility  Bed Mobility Overal bed mobility: Modified Independent                Transfers Overall transfer level: Needs assistance Equipment used: Rolling walker (2 wheeled) Transfers: Sit to/from Stand Sit to Stand: Min guard         General transfer comment: min/guard for safety  Ambulation/Gait Ambulation/Gait assistance: Min guard Ambulation Distance (Feet): 40 Feet Assistive device: Rolling walker (2 wheeled) Gait Pattern/deviations: Step-through pattern;Decreased stride length     General Gait Details: pt limited distance due to socks (prefers walking with her shoes), mobilizing well with RW  Stairs            Wheelchair Mobility    Modified Rankin  (Stroke Patients Only)       Balance Overall balance assessment:  (reports hx of falls however states none recently)                                           Pertinent Vitals/Pain Pain Assessment: No/denies pain    Home Living Family/patient expects to be discharged to:: Skilled nursing facility                      Prior Function Level of Independence: Independent with assistive device(s);Needs assistance   Gait / Transfers Assistance Needed: states she uses RW  ADL's / Homemaking Assistance Needed: reports she is able to bathe and dress herself  Comments: reports facility brings her meals      Hand Dominance        Extremity/Trunk Assessment               Lower Extremity Assessment: Overall WFL for tasks assessed         Communication   Communication: No difficulties  Cognition Arousal/Alertness: Awake/alert Behavior During Therapy: WFL for tasks assessed/performed Overall Cognitive Status: History of cognitive impairments - at baseline                      General Comments      Exercises        Assessment/Plan    PT Assessment Patient needs continued PT services  PT Diagnosis Difficulty walking   PT Problem List  Decreased activity tolerance;Decreased mobility  PT Treatment Interventions     PT Goals (Current goals can be found in the Care Plan section) Acute Rehab PT Goals PT Goal Formulation: With patient Time For Goal Achievement: 01/20/16 Potential to Achieve Goals: Good    Frequency     Barriers to discharge        Co-evaluation               End of Session Equipment Utilized During Treatment: Gait belt Activity Tolerance: Patient tolerated treatment well Patient left: in chair;with call bell/phone within reach;with chair alarm set           Time: 4098-1191 PT Time Calculation (min) (ACUTE ONLY): 16 min   Charges:   PT Evaluation $PT Eval Low Complexity: 1 Procedure     PT G  Codes:        Marlin Jarrard,KATHrine E 01/13/2016, 9:34 AM Zenovia Jarred, PT, DPT 01/13/2016 Pager: [phone]

## 2016-01-13 NOTE — Progress Notes (Signed)
ANTICOAGULATION CONSULT NOTE - Follow-up Consult  Pharmacy Consult for Warfarin Indication: PE  No Known Allergies  Patient Measurements: Height:  (157.5 cm) Weight: 198 lb 10.2 oz (90.1 kg) IBW/kg (Calculated) : 50.1 Heparin Dosing Weight:   Vital Signs: Temp: 98.4 F (36.9 C) (02/23 0442) Temp Source: Oral (02/23 0442) BP: 139/65 mmHg (02/23 0442) Pulse Rate: 84 (02/23 0442)  Labs:  Recent Labs  01/11/16 2348 01/12/16 0429 01/12/16 0502 01/12/16 1645 01/13/16 0458  HGB 10.0* 8.7*  --  10.0* 9.1*  HCT 32.5* 27.4*  --  30.6* 29.3*  PLT 312 294  --  273 287  APTT 72*  --   --   --   --   LABPROT 37.2*  --  41.1*  --  42.6*  INR 4.05*  --  4.62*  --  4.66*  CREATININE 1.99* 1.84*  --   --  1.79*    Estimated Creatinine Clearance: 26.6 mL/min (by C-G formula based on Cr of 1.79).   Medical History: Past Medical History  Diagnosis Date  . Diabetes mellitus without complication (HCC)   . Hypertension   . Anemia   . Allergy   . Arthritis   . Seizures (HCC)   . Syncope   . Chronic diastolic (congestive) heart failure (HCC)   . PE (pulmonary embolism)   . CKD (chronic kidney disease), stage III   . Chronic diastolic (congestive) heart failure (HCC)   . Dementia     Medications:  Prescriptions prior to admission  Medication Sig Dispense Refill Last Dose  . amLODipine (NORVASC) 10 MG tablet Take 10 mg by mouth daily.   01/11/2016 at 0900  . atorvastatin (LIPITOR) 10 MG tablet Take 10 mg by mouth daily.    01/11/2016 at 0900  . calcitRIOL (ROCALTROL) 0.25 MCG capsule Take 0.25 mcg by mouth 3 (three) times a week. Mon, Wed, Fri.   01/10/2016 at 0900  . Cholecalciferol (VITAMIN D) 2000 units tablet Take 2,000 Units by mouth daily.   01/11/2016 at 0900  . DULoxetine (CYMBALTA) 30 MG capsule Take 30 mg by mouth daily.    01/11/2016 at 1000  . furosemide (LASIX) 20 MG tablet Take 10 mg by mouth daily.    01/11/2016 at 1000  . HYDROcodone-acetaminophen (NORCO/VICODIN)  5-325 MG per tablet Take 1 tablet by mouth every 4 (four) hours as needed for pain. (Patient not taking: Reported on 01/11/2016) 180 tablet 0 Completed Course at Unknown time  . insulin aspart (NOVOLOG) 100 UNIT/ML injection Inject 0-10 Units into the skin 3 (three) times daily as needed for high blood sugar. Sliding Scale. 200-250=2 units, 251-300=4 units, 301-350 units=6 units, 351-400=8 units, 400=Call MD.   unknown  . insulin glargine (LANTUS) 100 UNIT/ML injection Inject 11 Units into the skin at bedtime.   01/10/2016 at 2200  . iron polysaccharides (NIFEREX) 150 MG capsule Take 150 mg by mouth 2 (two) times daily.   01/11/2016 at 0900  . latanoprost (XALATAN) 0.005 % ophthalmic solution Place 1 drop into both eyes at bedtime.    01/10/2016 at 2100  . lisinopril (PRINIVIL,ZESTRIL) 40 MG tablet Take 40 mg by mouth daily.   01/11/2016 at 0900  . Menthol, Topical Analgesic, (BIOFREEZE) 4 % GEL Apply 1 application topically 2 (two) times daily. To back and knees   01/11/2016 at 1000  . metoprolol tartrate (LOPRESSOR) 25 MG tablet Take 12.5 mg by mouth 2 (two) times daily.    01/11/2016 at 1000  . omeprazole (PRILOSEC) 20  MG capsule Take 20 mg by mouth 2 (two) times daily before a meal.   01/11/2016 at 1630  . oxybutynin (DITROPAN-XL) 10 MG 24 hr tablet Take 10 mg by mouth daily.    01/11/2016 at 0900  . Polyvinyl Alcohol-Povidone (FRESHKOTE) 2.7-2 % SOLN Apply 1 drop to eye 3 (three) times daily.   01/11/2016 at 1400  . rivastigmine (EXELON) 6 MG capsule Take 6 mg by mouth 2 (two) times daily.   01/11/2016 at 0900  . warfarin (COUMADIN) 3 MG tablet Take 3 mg by mouth daily.   01/10/2016 at 1600    Assessment: 81 yoF on chronic warfarin for hx PE presents with diarrhea, rectal bleeding and abdominal pain due to mild sepsis secondary to colitis worsened with supratherapeutic INR.  Warfarin held on admission and pharmacy consulted to resume when appropriate.   2/23:  INR remains supratherapeutic.   CBC shows  hemoglobin sl decrease, plts WNL, no bleeding noted by RN FLD ordered On ciprofloxacin which could cause increases in INR  Goal of Therapy:  INR 2-3  Plan:  Continue to hold warfarin Daily PT/INR Monitor CBC and signs/symptoms of bleeding  Haynes Hoehn, PharmD, BCPS 01/13/2016, 9:20 AM  Pager: [phone]

## 2016-01-14 LAB — PROTIME-INR
INR: 3.35 — ABNORMAL HIGH (ref 0.00–1.49)
Prothrombin Time: 32.3 seconds — ABNORMAL HIGH (ref 11.6–15.2)

## 2016-01-14 LAB — BASIC METABOLIC PANEL
ANION GAP: 9 (ref 5–15)
BUN: 20 mg/dL (ref 6–20)
CALCIUM: 8.5 mg/dL — AB (ref 8.9–10.3)
CHLORIDE: 114 mmol/L — AB (ref 101–111)
CO2: 18 mmol/L — ABNORMAL LOW (ref 22–32)
CREATININE: 1.88 mg/dL — AB (ref 0.44–1.00)
GFR, EST AFRICAN AMERICAN: 28 mL/min — AB (ref >60)
GFR, EST NON AFRICAN AMERICAN: 24 mL/min — AB (ref >60)
Glucose, Bld: 92 mg/dL (ref 65–99)
Potassium: 4 mmol/L (ref 3.5–5.1)
Sodium: 141 mmol/L (ref 135–145)

## 2016-01-14 LAB — GLUCOSE, CAPILLARY
GLUCOSE-CAPILLARY: 96 mg/dL (ref 65–99)
Glucose-Capillary: 110 mg/dL — ABNORMAL HIGH (ref 65–99)

## 2016-01-14 MED ORDER — CIPROFLOXACIN HCL 500 MG PO TABS
500.0000 mg | ORAL_TABLET | Freq: Every day | ORAL | Status: DC
Start: 2016-01-14 — End: 2016-08-29

## 2016-01-14 MED ORDER — INSULIN GLARGINE 100 UNIT/ML ~~LOC~~ SOLN: 6.0000 [IU] | Freq: Every day | SUBCUTANEOUS | Status: AC

## 2016-01-14 MED ORDER — WARFARIN SODIUM 1 MG PO TABS: 1.0000 mg | ORAL_TABLET | Freq: Every day | ORAL | Status: DC

## 2016-01-14 MED ORDER — METRONIDAZOLE 500 MG PO TABS: 500.0000 mg | ORAL_TABLET | Freq: Three times a day (TID) | ORAL | Status: DC

## 2016-01-14 NOTE — Progress Notes (Signed)
[facility] has arrived. Patient is at baseline mentation/stable. She denies any complaints at this time.

## 2016-01-14 NOTE — Progress Notes (Signed)
Patient is preparing for DC to facility. BP assessed 96/48, 72, 100% ra. BP medications were given this am.Patient denied dizziness, sob and or other complaints at this time. Will continue to monitor.

## 2016-01-14 NOTE — Care Management Note (Signed)
Case Management Note  Patient Details  Name: Bianca Wyatt MRN: [id number] Date of Birth: 02/25/1934  Subjective/Objective:                    Action/Plan:d/c SNF.   Expected Discharge Date:   (UNKNOWN)               Expected Discharge Plan:  Skilled Nursing Facility  In-House Referral:     Discharge planning Services  CM Consult  Post Acute Care Choice:    Choice offered to:     DME Arranged:    DME Agency:     HH Arranged:    HH Agency:     Status of Service:  Completed, signed off  Medicare Important Message Given:    Date Medicare IM Given:    Medicare IM give by:    Date Additional Medicare IM Given:    Additional Medicare Important Message give by:     If discussed at Long Length of Stay Meetings, dates discussed:    Additional Comments:  Lanier Clam, RN 01/14/2016, 12:37 PM

## 2016-01-14 NOTE — Progress Notes (Signed)
ANTICOAGULATION CONSULT NOTE - Follow-up Consult  Pharmacy Consult for Warfarin Indication: PE  No Known Allergies  Patient Measurements: Height:  (157.5 cm) Weight: 201 lb 4.5 oz (91.3 kg) IBW/kg (Calculated) : 50.1 Heparin Dosing Weight:   Vital Signs: Temp: 98.5 F (36.9 C) (02/24 0527) Temp Source: Oral (02/24 0527) BP: 129/74 mmHg (02/24 0527) Pulse Rate: 75 (02/24 0527)  Labs:  Recent Labs  01/11/16 2348 01/12/16 0429 01/12/16 0502 01/12/16 1645 01/13/16 0458 01/13/16 1711 01/14/16 0531  HGB 10.0* 8.7*  --  10.0* 9.1* 9.3*  --   HCT 32.5* 27.4*  --  30.6* 29.3* 29.0*  --   PLT 312 294  --  273 287 298  --   APTT 72*  --   --   --   --   --   --   LABPROT 37.2*  --  41.1*  --  42.6*  --  32.3*  INR 4.05*  --  4.62*  --  4.66*  --  3.35*  CREATININE 1.99* 1.84*  --   --  1.79*  --   --     Estimated Creatinine Clearance: 26.8 mL/min (by C-G formula based on Cr of 1.79).   Medical History: Past Medical History  Diagnosis Date  . Diabetes mellitus without complication (HCC)   . Hypertension   . Anemia   . Allergy   . Arthritis   . Seizures (HCC)   . Syncope   . Chronic diastolic (congestive) heart failure (HCC)   . PE (pulmonary embolism)   . CKD (chronic kidney disease), stage III   . Chronic diastolic (congestive) heart failure (HCC)   . Dementia     Medications:  Prescriptions prior to admission  Medication Sig Dispense Refill Last Dose  . amLODipine (NORVASC) 10 MG tablet Take 10 mg by mouth daily.   01/11/2016 at 0900  . atorvastatin (LIPITOR) 10 MG tablet Take 10 mg by mouth daily.    01/11/2016 at 0900  . calcitRIOL (ROCALTROL) 0.25 MCG capsule Take 0.25 mcg by mouth 3 (three) times a week. Mon, Wed, Fri.   01/10/2016 at 0900  . Cholecalciferol (VITAMIN D) 2000 units tablet Take 2,000 Units by mouth daily.   01/11/2016 at 0900  . DULoxetine (CYMBALTA) 30 MG capsule Take 30 mg by mouth daily.    01/11/2016 at 1000  . furosemide (LASIX) 20 MG  tablet Take 10 mg by mouth daily.    01/11/2016 at 1000  . HYDROcodone-acetaminophen (NORCO/VICODIN) 5-325 MG per tablet Take 1 tablet by mouth every 4 (four) hours as needed for pain. (Patient not taking: Reported on 01/11/2016) 180 tablet 0 Completed Course at Unknown time  . insulin aspart (NOVOLOG) 100 UNIT/ML injection Inject 0-10 Units into the skin 3 (three) times daily as needed for high blood sugar. Sliding Scale. 200-250=2 units, 251-300=4 units, 301-350 units=6 units, 351-400=8 units, 400=Call MD.   unknown  . insulin glargine (LANTUS) 100 UNIT/ML injection Inject 11 Units into the skin at bedtime.   01/10/2016 at 2200  . iron polysaccharides (NIFEREX) 150 MG capsule Take 150 mg by mouth 2 (two) times daily.   01/11/2016 at 0900  . latanoprost (XALATAN) 0.005 % ophthalmic solution Place 1 drop into both eyes at bedtime.    01/10/2016 at 2100  . lisinopril (PRINIVIL,ZESTRIL) 40 MG tablet Take 40 mg by mouth daily.   01/11/2016 at 0900  . Menthol, Topical Analgesic, (BIOFREEZE) 4 % GEL Apply 1 application topically 2 (two) times daily. To  back and knees   01/11/2016 at 1000  . metoprolol tartrate (LOPRESSOR) 25 MG tablet Take 12.5 mg by mouth 2 (two) times daily.    01/11/2016 at 1000  . omeprazole (PRILOSEC) 20 MG capsule Take 20 mg by mouth 2 (two) times daily before a meal.   01/11/2016 at 1630  . oxybutynin (DITROPAN-XL) 10 MG 24 hr tablet Take 10 mg by mouth daily.    01/11/2016 at 0900  . Polyvinyl Alcohol-Povidone (FRESHKOTE) 2.7-2 % SOLN Apply 1 drop to eye 3 (three) times daily.   01/11/2016 at 1400  . rivastigmine (EXELON) 6 MG capsule Take 6 mg by mouth 2 (two) times daily.   01/11/2016 at 0900  . warfarin (COUMADIN) 3 MG tablet Take 3 mg by mouth daily.   01/10/2016 at 1600    Assessment: 81 yoF on chronic warfarin for hx PE presents with diarrhea, rectal bleeding and abdominal pain due to mild sepsis secondary to colitis worsened with supratherapeutic INR.  Warfarin held on admission and  pharmacy consulted to resume when appropriate.   2/24:  INR remains supratherapeutic but trending down  CBC: hgb low but stable overnight, plts WNL, no bleeding noted by RN FLD ordered - eating 25-50% meals  On ciprofloxacin which can cause increases in INR  Goal of Therapy:  INR 2-3  Plan:  Continue to hold warfarin Daily PT/INR Monitor CBC and signs/symptoms of bleeding  Haynes Hoehn, PharmD, BCPS 01/14/2016, 7:51 AM  Pager: [phone]

## 2016-01-14 NOTE — Progress Notes (Signed)
Patient is set to discharge back to [facility] SNF today. Patient aware. Discharge packet given to RN, [facility]. [facility] called for transport.     Lincoln Maxin, LCSW [facility] Clinical Social Worker cell #: [phone]

## 2016-01-14 NOTE — Plan of Care (Signed)
Problem: Education: Goal: Ability to verbalize understanding of medication therapies will improve Outcome: Completed/Met Date Met:  01/14/16 Education provided, will continue to reinforce

## 2016-01-14 NOTE — Progress Notes (Signed)
Pharmacy Antibiotic Note  Cason Dabney is a 82 y.o. female admitted on 01/11/2016 with Colitis.  Pharmacy has been consulted for Ciprofloxacin dosing.   2/24:  CrCl remains < 30 ml/min, SCr slight bump overnight, per notes baseline 1.9-2.3 Antibiotics now PO WBC improving  Plan: Continue ciprofloxacin  PO q24h Continue flagyl  PO q8h F/u renal fxn  Height:  (157.5 cm) Weight: 201 lb 4.5 oz (91.3 kg) IBW/kg (Calculated) : 50.1  Temp (24hrs), Avg:99 F (37.2 C), Min:98.5 F (36.9 C), Max:99.3 F (37.4 C)   Recent Labs Lab 01/11/16 2348 01/12/16 0429 01/12/16 0758 01/12/16 1645 01/13/16 0458 01/13/16 1711 01/14/16 0530  WBC 20.9* 20.5*  --  19.0* 14.3* 11.3*  --   CREATININE 1.99* 1.84*  --   --  1.79*  --  1.88*  LATICACIDVEN  --  1.2 0.9  --   --   --   --     Estimated Creatinine Clearance: 25.5 mL/min (by C-G formula based on Cr of 1.88).    No Known Allergies  Antimicrobials this admission: Ciprofloxacin 2/22 >>  Flagyl 2/22 >>   Thank you for allowing pharmacy to be a part of this patient's care.  Wynonia Hazard 01/14/2016 8:48 AM

## 2016-01-14 NOTE — Discharge Summary (Signed)
Physician Discharge Summary  Eriko Economos UYQ:[id number] DOB: 1933/12/20 DOA: 01/11/2016  PCP: Angela Cox, MD  Admit date: 01/11/2016 Discharge date: 01/14/2016  Time spent: 35 minutes  Recommendations for Outpatient Follow-up:  Cipro/flagyl until 3/3 Monitor INR frequently while on abx-- INR here on 2/24 was 3.35- hold for 2/24 and start 1 mg on Saturday and recheck on monday To SNF  Discharge Diagnoses:  Principal Problem:   Colitis Active Problems:   Pure hypercholesterolemia   Esophageal reflux   Pulmonary embolism (HCC)   Dementia without behavioral disturbance   Depression   Diabetes (HCC)   Sepsis (HCC)   Rectal bleeding   HLD (hyperlipidemia)   CKD (chronic kidney disease), stage III   Discharge Condition: improved  Diet recommendation: diabetic  Filed Weights   01/12/16 0457 01/13/16 0442 01/14/16 0527  Weight: 89.812 kg (198 lb) 90.1 kg (198 lb 10.2 oz) 91.3 kg (201 lb 4.5 oz)    History of present illness:  Bianca Wyatt is a 82 y.o. female with PMH of hypertension, diabetes mellitus, GERD, depression, dementia, seizure, diastolic congestive heart failure, pulmonary embolism on Coumadin, dementia, chronic kidney disease-stage III, who presents with diarrhea, rectal bleeding and abdominal pain.  Patient reports that she has been having rectal bleeding, diarrhea and abdominal pain in the past 4 days intermittently. She has 1-2 bloody stool per day. She also have moderate abdominal pain, which is located and periumbilical area, constant, nonradiating. Patient has mild nausea, but no vomiting, fever or chills. No symptoms of UTI. Patient does not have chest pain, shortness breath, cough, unilateral weakness.  In ED, patient was found to have WBC 20.9, negative FOBT, stable hemoglobin 9.2 on 01/20/11--> 10.0 today, temperature normal, tachypnea, INR 4.05, PTT 72, stable renal function. CT abdomen/pelvis showed segmental colitis. Patient admitted to inpatient  for further evaluation and treatment  Hospital Course:  Colitis and mild sepsis due to colitis: pt has negative FOBT, stable hemoglobin  Lactate 1. -IV Cipro and Flagyl- change to PO today and continue until 3/3 -When necessary Zofran for Nause -c diff pcr negative  Rectal bleeding: most likely from colitis, which was worsened by supratherapeutic INR secondary to Coumadin use.  -Hemoglobin stable.  -Pt does not have EGD or colonoscopy on record- follow up outpatient -resolved  HLD: Last LDL was 94 on 01/15/11 -Continue home medications: Lipitor  DM-II: Last A1c 6.2 on 01/14/11, well controled. Patient is taking Lantus at home -will decrease Lantus dose from 11 units to 6 units daily  -SSI  GERD: -PPI  Pulmonary embolism [facility]): patient is on Coumadin. INR 4.05. Though patient has rectal bleeding, hemoglobin stable. Will continue Coumadin -will consult pharmacy for continuation of Coumadin  Dementia without behavioral disturbance: -continue Exelon  Depression and anxiety: Stable, no suicidal or homicidal ideations. -Continue home medications: Cymbalta  Chronic diastolic congestive heart failure: 2D echo on 09/27/10 showed EF 60% with grade 1 diastolic dysfunction. Patient is on low-dose Lasix, 10 mg daily at home. No leg edema or JVD. CHF compensated. -continue metoprolol -resume lasix  Hypertension:  -Continue metoprolol, lisinopril and amlodipine  CKD-III: stable. Baseline creatinine 1.9-2.3.    Procedures:    Consultations:    Discharge Exam: Filed Vitals:   01/14/16 1010 01/14/16 1028  BP: 122/62 122/62  Pulse: 79 79  Temp: 98.6 F (37 C)   Resp:      General: awake, NAD- anxious to return home   Discharge Instructions   Discharge Instructions    Diet - low  sodium heart healthy    Complete by:  As directed      Diet Carb Modified    Complete by:  As directed      Discharge instructions    Complete by:  As directed   Cipro/flagyl until  3/3 Monitor INR frequently while on abx-- INR here on 2/24 was 3.35- hold for 2/24 and start 1 mg on Saturday and recheck on monday     Increase activity slowly    Complete by:  As directed           Current Discharge Medication List    START taking these medications   Details  ciprofloxacin (CIPRO) 500 MG tablet Take 1 tablet (500 mg total) by mouth daily at 10 pm.    metroNIDAZOLE (FLAGYL) 500 MG tablet Take 1 tablet (500 mg total) by mouth every 8 (eight) hours.      CONTINUE these medications which have CHANGED   Details  insulin glargine (LANTUS) 100 UNIT/ML injection Inject 0.06 mLs (6 Units total) into the skin at bedtime. Qty: 10 mL, Refills: 11    warfarin (COUMADIN) 1 MG tablet Take 1 tablet (1 mg total) by mouth daily.      CONTINUE these medications which have NOT CHANGED   Details  amLODipine (NORVASC) 10 MG tablet Take 10 mg by mouth daily.    atorvastatin (LIPITOR) 10 MG tablet Take 10 mg by mouth daily.     calcitRIOL (ROCALTROL) 0.25 MCG capsule Take 0.25 mcg by mouth 3 (three) times a week. Mon, Wed, Fri.    Cholecalciferol (VITAMIN D) 2000 units tablet Take 2,000 Units by mouth daily.    DULoxetine (CYMBALTA) 30 MG capsule Take 30 mg by mouth daily.     furosemide (LASIX) 20 MG tablet Take 10 mg by mouth daily.     insulin aspart (NOVOLOG) 100 UNIT/ML injection Inject 0-10 Units into the skin 3 (three) times daily as needed for high blood sugar. Sliding Scale. 200-250=2 units, 251-300=4 units, 301-350 units=6 units, 351-400=8 units, 400=Call MD.    iron polysaccharides (NIFEREX) 150 MG capsule Take 150 mg by mouth 2 (two) times daily.    latanoprost (XALATAN) 0.005 % ophthalmic solution Place 1 drop into both eyes at bedtime.     lisinopril (PRINIVIL,ZESTRIL) 40 MG tablet Take 40 mg by mouth daily.    Menthol, Topical Analgesic, (BIOFREEZE) 4 % GEL Apply 1 application topically 2 (two) times daily. To back and knees    metoprolol tartrate  (LOPRESSOR) 25 MG tablet Take 12.5 mg by mouth 2 (two) times daily.     omeprazole (PRILOSEC) 20 MG capsule Take 20 mg by mouth 2 (two) times daily before a meal.    oxybutynin (DITROPAN-XL) 10 MG 24 hr tablet Take 10 mg by mouth daily.     Polyvinyl Alcohol-Povidone (FRESHKOTE) 2.7-2 % SOLN Apply 1 drop to eye 3 (three) times daily.    rivastigmine (EXELON) 6 MG capsule Take 6 mg by mouth 2 (two) times daily.      STOP taking these medications     HYDROcodone-acetaminophen (NORCO/VICODIN) 5-325 MG per tablet        No Known Allergies    The results of significant diagnostics from this hospitalization (including imaging, microbiology, ancillary and laboratory) are listed below for reference.    Significant Diagnostic Studies: Ct Abdomen Pelvis Wo Contrast  01/12/2016  CLINICAL DATA:  Acute onset of intermittent rectal bleeding and upper abdominal pain. Nausea and leukocytosis. Initial encounter. EXAM: CT ABDOMEN  AND PELVIS WITHOUT CONTRAST TECHNIQUE: Multidetector CT imaging of the abdomen and pelvis was performed following the standard protocol without IV contrast. COMPARISON:  CT of the abdomen and pelvis from 10/18/2010, and renal ultrasound performed 01/14/2011 FINDINGS: The visualized lung bases are clear. Scattered coronary artery calcification is noted. Mitral valve calcification is noted. The liver and spleen are unremarkable in appearance. Stones are seen dependently within the gallbladder. The gallbladder is otherwise unremarkable. The pancreas and adrenal glands are unremarkable. The kidneys are unremarkable in appearance. There is no evidence of hydronephrosis. No renal or ureteral stones are seen. No perinephric stranding is appreciated. No free fluid is identified. The small bowel is unremarkable in appearance. The stomach is within normal limits. No acute vascular abnormalities are seen. Scattered calcification is noted along the abdominal aorta and its branches. Diffuse wall  thickening is noted along the transverse and descending colon, with associated soft tissue inflammation, compatible with segmental colitis, either infectious or inflammatory in nature. The appendix is not definitely characterized; there is no evidence of appendicitis. Scattered diverticulosis is noted along the descending and sigmoid colon, without evidence of diverticulitis. The bladder is moderately distended and grossly unremarkable. The patient appears status post hysterectomy. A small amount of free fluid is seen within the pelvis. No suspicious adnexal masses are seen. No inguinal lymphadenopathy is seen. No acute osseous abnormalities are identified. There is mild grade 1 anterolisthesis of L4 on L5, reflecting underlying facet disease. IMPRESSION: 1. Diffuse wall thickening along the transverse and descending colon, with associated soft tissue inflammation, compatible with segmental colitis, either infectious or inflammatory in nature. 2. Small amount of free fluid within the pelvis may reflect the colitis. 3. Cholelithiasis.  Gallbladder otherwise unremarkable. 4. Scattered calcification along the abdominal aorta and its branches. 5. Scattered coronary artery calcification noted. Mitral valve calcification seen. 6. Scattered diverticulosis along the descending and sigmoid colon, without evidence of diverticulitis. Electronically Signed   By: Roanna Raider M.D.   On: 01/12/2016 01:55    Microbiology: Recent Results (from the past 240 hour(s))  Culture, blood (x 2)     Status: None (Preliminary result)   Collection Time: 01/12/16  4:30 AM  Result Value Ref Range Status   Specimen Description BLOOD  Final   Special Requests BOTTLES DRAWN AEROBIC AND ANAEROBIC 5CC  Final   Culture   Final    NO GROWTH 1 DAY Performed at [facility]    Report Status PENDING  Incomplete  Culture, blood (x 2)     Status: None (Preliminary result)   Collection Time: 01/12/16  4:45 PM  Result Value Ref  Range Status   Specimen Description BLOOD RIGHT ARM  Final   Special Requests   Final    BOTTLES DRAWN AEROBIC AND ANAEROBIC 5CC AER,2CC ANA   Culture   Final    NO GROWTH < 24 HOURS Performed at [facility]    Report Status PENDING  Incomplete  MRSA PCR Screening     Status: None   Collection Time: 01/12/16  6:10 PM  Result Value Ref Range Status   MRSA by PCR NEGATIVE NEGATIVE Final    Comment:        The GeneXpert MRSA Assay (FDA approved for NASAL specimens only), is one component of a comprehensive MRSA colonization surveillance program. It is not intended to diagnose MRSA infection nor to guide or monitor treatment for MRSA infections.   C difficile quick scan w PCR reflex  Status: None   Collection Time: 01/12/16  9:14 PM  Result Value Ref Range Status   C Diff antigen NEGATIVE NEGATIVE Final   C Diff toxin NEGATIVE NEGATIVE Final   C Diff interpretation Negative for toxigenic C. difficile  Final     Labs: Basic Metabolic Panel:  Recent Labs Lab 01/11/16 2348 01/12/16 0429 01/13/16 0458 01/14/16 0530  NA 145 143 143 141  K 3.9 3.4* 3.7 4.0  CL 116* 116* 115* 114*  CO2 19* 18* 19* 18*  GLUCOSE 118* 113* 97 92  BUN 30* 27* 24* 20  CREATININE 1.99* 1.84* 1.79* 1.88*  CALCIUM 9.4 8.7* 8.9 8.5*   Liver Function Tests:  Recent Labs Lab 01/11/16 2348  AST 21  ALT 20  ALKPHOS 75  BILITOT 0.7  PROT 8.1  ALBUMIN 3.6   No results for input(s): LIPASE, AMYLASE in the last 168 hours. No results for input(s): AMMONIA in the last 168 hours. CBC:  Recent Labs Lab 01/11/16 2348 01/12/16 0429 01/12/16 1645 01/13/16 0458 01/13/16 1711  WBC 20.9* 20.5* 19.0* 14.3* 11.3*  NEUTROABS 17.9*  --   --   --   --   HGB 10.0* 8.7* 10.0* 9.1* 9.3*  HCT 32.5* 27.4* 30.6* 29.3* 29.0*  MCV 88.1 87.5 86.9 87.2 87.1  PLT 312 294 273 287 298   Cardiac Enzymes: No results for input(s): CKTOTAL, CKMB, CKMBINDEX, TROPONINI in the last 168 hours. BNP: BNP  (last 3 results)  Recent Labs  01/12/16 0429  BNP 180.2*    ProBNP (last 3 results) No results for input(s): PROBNP in the last 8760 hours.  CBG:  Recent Labs Lab 01/13/16 1216 01/13/16 1641 01/13/16 1954 01/14/16 0736 01/14/16 1136  GLUCAP 86 134* 102* 110* 96       Signed:  Keiton Cosma U Linnaea Ahn DO.  [facility] 01/14/2016, 12:36 PM

## 2016-01-15 LAB — TYPE AND SCREEN
ABO/RH(D): A POS
ANTIBODY SCREEN: POSITIVE
DAT, IgG: NEGATIVE
Donor AG Type: NEGATIVE
Donor AG Type: NEGATIVE
PT AG Type: NEGATIVE
UNIT DIVISION: 0
UNIT DIVISION: 0

## 2016-01-17 LAB — CULTURE, BLOOD (ROUTINE X 2)
CULTURE: NO GROWTH
Culture: NO GROWTH

## 2016-08-29 ENCOUNTER — Emergency Department (HOSPITAL_COMMUNITY): Payer: Medicare Other

## 2016-08-29 ENCOUNTER — Inpatient Hospital Stay (HOSPITAL_COMMUNITY)
Admission: EM | Admit: 2016-08-29 | Discharge: 2016-09-20 | DRG: 963 | Disposition: E | Payer: Medicare Other | Attending: Family Medicine | Admitting: Family Medicine

## 2016-08-29 ENCOUNTER — Encounter (HOSPITAL_COMMUNITY): Payer: Self-pay | Admitting: Family Medicine

## 2016-08-29 DIAGNOSIS — S7292XS Unspecified fracture of left femur, sequela: Secondary | ICD-10-CM

## 2016-08-29 DIAGNOSIS — Z8249 Family history of ischemic heart disease and other diseases of the circulatory system: Secondary | ICD-10-CM

## 2016-08-29 DIAGNOSIS — N183 Chronic kidney disease, stage 3 (moderate): Secondary | ICD-10-CM | POA: Diagnosis present

## 2016-08-29 DIAGNOSIS — S7291XS Unspecified fracture of right femur, sequela: Secondary | ICD-10-CM

## 2016-08-29 DIAGNOSIS — Z7901 Long term (current) use of anticoagulants: Secondary | ICD-10-CM

## 2016-08-29 DIAGNOSIS — E785 Hyperlipidemia, unspecified: Secondary | ICD-10-CM | POA: Diagnosis present

## 2016-08-29 DIAGNOSIS — M25562 Pain in left knee: Secondary | ICD-10-CM | POA: Diagnosis not present

## 2016-08-29 DIAGNOSIS — F0390 Unspecified dementia without behavioral disturbance: Secondary | ICD-10-CM | POA: Diagnosis present

## 2016-08-29 DIAGNOSIS — Z86718 Personal history of other venous thrombosis and embolism: Secondary | ICD-10-CM

## 2016-08-29 DIAGNOSIS — S7292XA Unspecified fracture of left femur, initial encounter for closed fracture: Secondary | ICD-10-CM | POA: Diagnosis present

## 2016-08-29 DIAGNOSIS — Z794 Long term (current) use of insulin: Secondary | ICD-10-CM

## 2016-08-29 DIAGNOSIS — W1830XA Fall on same level, unspecified, initial encounter: Secondary | ICD-10-CM | POA: Diagnosis present

## 2016-08-29 DIAGNOSIS — Y92129 Unspecified place in nursing home as the place of occurrence of the external cause: Secondary | ICD-10-CM

## 2016-08-29 DIAGNOSIS — D631 Anemia in chronic kidney disease: Secondary | ICD-10-CM | POA: Diagnosis present

## 2016-08-29 DIAGNOSIS — J9690 Respiratory failure, unspecified, unspecified whether with hypoxia or hypercapnia: Secondary | ICD-10-CM | POA: Diagnosis not present

## 2016-08-29 DIAGNOSIS — T791XXA Fat embolism (traumatic), initial encounter: Secondary | ICD-10-CM | POA: Diagnosis not present

## 2016-08-29 DIAGNOSIS — S72409A Unspecified fracture of lower end of unspecified femur, initial encounter for closed fracture: Secondary | ICD-10-CM

## 2016-08-29 DIAGNOSIS — X58XXXA Exposure to other specified factors, initial encounter: Secondary | ICD-10-CM | POA: Diagnosis not present

## 2016-08-29 DIAGNOSIS — I5032 Chronic diastolic (congestive) heart failure: Secondary | ICD-10-CM | POA: Diagnosis present

## 2016-08-29 DIAGNOSIS — R7989 Other specified abnormal findings of blood chemistry: Secondary | ICD-10-CM | POA: Diagnosis present

## 2016-08-29 DIAGNOSIS — I130 Hypertensive heart and chronic kidney disease with heart failure and stage 1 through stage 4 chronic kidney disease, or unspecified chronic kidney disease: Secondary | ICD-10-CM | POA: Diagnosis present

## 2016-08-29 DIAGNOSIS — Z96653 Presence of artificial knee joint, bilateral: Secondary | ICD-10-CM | POA: Diagnosis present

## 2016-08-29 DIAGNOSIS — S72491A Other fracture of lower end of right femur, initial encounter for closed fracture: Secondary | ICD-10-CM | POA: Diagnosis present

## 2016-08-29 DIAGNOSIS — S72492A Other fracture of lower end of left femur, initial encounter for closed fracture: Secondary | ICD-10-CM | POA: Diagnosis not present

## 2016-08-29 DIAGNOSIS — Z79899 Other long term (current) drug therapy: Secondary | ICD-10-CM

## 2016-08-29 DIAGNOSIS — S7291XA Unspecified fracture of right femur, initial encounter for closed fracture: Secondary | ICD-10-CM | POA: Diagnosis present

## 2016-08-29 DIAGNOSIS — S7290XA Unspecified fracture of unspecified femur, initial encounter for closed fracture: Secondary | ICD-10-CM | POA: Diagnosis present

## 2016-08-29 DIAGNOSIS — E1122 Type 2 diabetes mellitus with diabetic chronic kidney disease: Secondary | ICD-10-CM | POA: Diagnosis present

## 2016-08-29 DIAGNOSIS — N189 Chronic kidney disease, unspecified: Secondary | ICD-10-CM | POA: Diagnosis present

## 2016-08-29 DIAGNOSIS — T148XXA Other injury of unspecified body region, initial encounter: Secondary | ICD-10-CM

## 2016-08-29 DIAGNOSIS — I2699 Other pulmonary embolism without acute cor pulmonale: Secondary | ICD-10-CM | POA: Diagnosis present

## 2016-08-29 DIAGNOSIS — K219 Gastro-esophageal reflux disease without esophagitis: Secondary | ICD-10-CM | POA: Diagnosis present

## 2016-08-29 HISTORY — DX: Major depressive disorder, single episode, unspecified: F32.9

## 2016-08-29 HISTORY — DX: Hemorrhage of anus and rectum: K62.5

## 2016-08-29 HISTORY — DX: Pure hypercholesterolemia, unspecified: E78.00

## 2016-08-29 HISTORY — DX: Depression, unspecified: F32.A

## 2016-08-29 HISTORY — DX: Gastro-esophageal reflux disease without esophagitis: K21.9

## 2016-08-29 HISTORY — DX: Hyperlipidemia, unspecified: E78.5

## 2016-08-29 HISTORY — DX: Noninfective gastroenteritis and colitis, unspecified: K52.9

## 2016-08-29 HISTORY — DX: Sepsis, unspecified organism: A41.9

## 2016-08-29 LAB — CBC WITH DIFFERENTIAL/PLATELET
BASOS PCT: 0 %
Basophils Absolute: 0 10*3/uL (ref 0.0–0.1)
EOS ABS: 0 10*3/uL (ref 0.0–0.7)
EOS PCT: 0 %
HCT: 35 % — ABNORMAL LOW (ref 36.0–46.0)
Hemoglobin: 11.1 g/dL — ABNORMAL LOW (ref 12.0–15.0)
Lymphocytes Relative: 13 %
Lymphs Abs: 1.7 10*3/uL (ref 0.7–4.0)
MCH: 28.2 pg (ref 26.0–34.0)
MCHC: 31.7 g/dL (ref 30.0–36.0)
MCV: 89.1 fL (ref 78.0–100.0)
MONO ABS: 0.8 10*3/uL (ref 0.1–1.0)
MONOS PCT: 6 %
Neutro Abs: 10.4 10*3/uL — ABNORMAL HIGH (ref 1.7–7.7)
Neutrophils Relative %: 81 %
Platelets: 277 10*3/uL (ref 150–400)
RBC: 3.93 MIL/uL (ref 3.87–5.11)
RDW: 15.1 % (ref 11.5–15.5)
WBC: 12.9 10*3/uL — ABNORMAL HIGH (ref 4.0–10.5)

## 2016-08-29 LAB — BASIC METABOLIC PANEL
Anion gap: 10 (ref 5–15)
BUN: 40 mg/dL — ABNORMAL HIGH (ref 6–20)
CALCIUM: 10 mg/dL (ref 8.9–10.3)
CO2: 23 mmol/L (ref 22–32)
CREATININE: 2.95 mg/dL — AB (ref 0.44–1.00)
Chloride: 107 mmol/L (ref 101–111)
GFR calc non Af Amer: 14 mL/min — ABNORMAL LOW (ref >60)
GFR, EST AFRICAN AMERICAN: 16 mL/min — AB (ref >60)
GLUCOSE: 147 mg/dL — AB (ref 65–99)
Potassium: 4.4 mmol/L (ref 3.5–5.1)
Sodium: 140 mmol/L (ref 135–145)

## 2016-08-29 LAB — PROTIME-INR
INR: 1.78
Prothrombin Time: 20.9 seconds — ABNORMAL HIGH (ref 11.4–15.2)

## 2016-08-29 LAB — I-STAT TROPONIN, ED: TROPONIN I, POC: 0.06 ng/mL (ref 0.00–0.08)

## 2016-08-29 MED ORDER — INSULIN ASPART 100 UNIT/ML ~~LOC~~ SOLN: 2.0000 [IU] | Freq: Three times a day (TID) | SUBCUTANEOUS | Status: DC | PRN

## 2016-08-29 MED ORDER — LATANOPROST 0.005 % OP SOLN
1.0000 [drp] | Freq: Every day | OPHTHALMIC | Status: DC
  Administered 2016-08-29 – 2016-08-30 (×2): 1 [drp] via OPHTHALMIC
  Filled 2016-08-29: qty 2.5

## 2016-08-29 MED ORDER — SODIUM CHLORIDE 0.9 % IV SOLN
INTRAVENOUS | Status: AC
  Administered 2016-08-29 – 2016-08-30 (×2): via INTRAVENOUS

## 2016-08-29 MED ORDER — METOPROLOL TARTRATE 25 MG PO TABS
12.5000 mg | ORAL_TABLET | Freq: Two times a day (BID) | ORAL | Status: DC
  Administered 2016-08-29 – 2016-08-31 (×3): 12.5 mg via ORAL
  Filled 2016-08-29 (×2): qty 1

## 2016-08-29 MED ORDER — MORPHINE SULFATE (PF) 4 MG/ML IV SOLN
4.0000 mg | INTRAVENOUS | Status: DC | PRN
  Administered 2016-08-29: 4 mg via INTRAVENOUS
  Filled 2016-08-29: qty 1

## 2016-08-29 MED ORDER — DIVALPROEX SODIUM 125 MG PO CSDR
250.0000 mg | DELAYED_RELEASE_CAPSULE | Freq: Three times a day (TID) | ORAL | Status: DC
  Administered 2016-08-29 – 2016-08-31 (×5): 250 mg via ORAL
  Filled 2016-08-29 (×7): qty 2

## 2016-08-29 MED ORDER — OXYBUTYNIN CHLORIDE ER 5 MG PO TB24
10.0000 mg | ORAL_TABLET | Freq: Every day | ORAL | Status: DC
  Administered 2016-08-30 – 2016-08-31 (×2): 10 mg via ORAL
  Filled 2016-08-29 (×2): qty 2

## 2016-08-29 MED ORDER — ACETAMINOPHEN 500 MG PO TABS
1000.0000 mg | ORAL_TABLET | Freq: Once | ORAL | Status: AC
  Administered 2016-08-29: 1000 mg via ORAL
  Filled 2016-08-29: qty 2

## 2016-08-29 MED ORDER — VITAMIN D3 25 MCG (1000 UNIT) PO TABS
2000.0000 [IU] | ORAL_TABLET | Freq: Every day | ORAL | Status: DC
  Administered 2016-08-30 – 2016-08-31 (×2): 2000 [IU] via ORAL
  Filled 2016-08-29 (×2): qty 2

## 2016-08-29 MED ORDER — ONDANSETRON HCL 4 MG/2ML IJ SOLN: 4.0000 mg | Freq: Four times a day (QID) | INTRAMUSCULAR | Status: DC | PRN

## 2016-08-29 MED ORDER — CALCITRIOL 0.25 MCG PO CAPS
0.2500 ug | ORAL_CAPSULE | Freq: Every day | ORAL | Status: DC
  Administered 2016-08-30 – 2016-08-31 (×2): 0.25 ug via ORAL
  Filled 2016-08-29 (×2): qty 1

## 2016-08-29 MED ORDER — RIVASTIGMINE TARTRATE 3 MG PO CAPS
6.0000 mg | ORAL_CAPSULE | Freq: Two times a day (BID) | ORAL | Status: DC
  Administered 2016-08-29 – 2016-08-31 (×4): 6 mg via ORAL
  Filled 2016-08-29 (×5): qty 2

## 2016-08-29 MED ORDER — DULOXETINE HCL 60 MG PO CPEP
60.0000 mg | ORAL_CAPSULE | Freq: Every day | ORAL | Status: DC
  Administered 2016-08-30 – 2016-08-31 (×2): 60 mg via ORAL
  Filled 2016-08-29 (×2): qty 1

## 2016-08-29 MED ORDER — POLYSACCHARIDE IRON COMPLEX 150 MG PO CAPS
150.0000 mg | ORAL_CAPSULE | Freq: Two times a day (BID) | ORAL | Status: DC
  Administered 2016-08-29 – 2016-08-31 (×4): 150 mg via ORAL
  Filled 2016-08-29 (×4): qty 1

## 2016-08-29 MED ORDER — ATORVASTATIN CALCIUM 10 MG PO TABS
10.0000 mg | ORAL_TABLET | Freq: Every evening | ORAL | Status: DC
  Administered 2016-08-30: 10 mg via ORAL
  Filled 2016-08-29 (×2): qty 1

## 2016-08-29 MED ORDER — INSULIN GLARGINE 100 UNIT/ML ~~LOC~~ SOLN
6.0000 [IU] | Freq: Every day | SUBCUTANEOUS | Status: DC
  Administered 2016-08-29: 6 [IU] via SUBCUTANEOUS
  Filled 2016-08-29 (×3): qty 0.06

## 2016-08-29 MED ORDER — MORPHINE SULFATE (PF) 2 MG/ML IV SOLN
2.0000 mg | INTRAVENOUS | Status: DC | PRN
  Administered 2016-08-29: 2 mg via INTRAVENOUS
  Filled 2016-08-29: qty 1

## 2016-08-29 MED ORDER — ONDANSETRON HCL 4 MG PO TABS: 4.0000 mg | ORAL_TABLET | Freq: Four times a day (QID) | ORAL | Status: DC | PRN

## 2016-08-29 MED ORDER — LISINOPRIL 20 MG PO TABS
20.0000 mg | ORAL_TABLET | Freq: Every day | ORAL | Status: DC
  Filled 2016-08-29: qty 1

## 2016-08-29 MED ORDER — OXYCODONE HCL 5 MG PO TABS
5.0000 mg | ORAL_TABLET | ORAL | Status: DC | PRN
  Administered 2016-08-30 – 2016-08-31 (×8): 5 mg via ORAL
  Filled 2016-08-29 (×9): qty 1

## 2016-08-29 MED ORDER — POLYVINYL ALCOHOL 1.4 % OP SOLN
1.0000 [drp] | Freq: Three times a day (TID) | OPHTHALMIC | Status: DC
  Administered 2016-08-29 – 2016-08-31 (×5): 1 [drp] via OPHTHALMIC
  Filled 2016-08-29: qty 15

## 2016-08-29 MED ORDER — AMLODIPINE BESYLATE 10 MG PO TABS
10.0000 mg | ORAL_TABLET | Freq: Every day | ORAL | Status: DC
  Filled 2016-08-29 (×2): qty 1

## 2016-08-29 NOTE — ED Notes (Signed)
Will give bedside report.  

## 2016-08-29 NOTE — ED Notes (Signed)
Lab reports PT/INR will be added to the blood already in the main lab.

## 2016-08-29 NOTE — ED Notes (Signed)
Ortho has been notified of order for long leg splint.

## 2016-08-29 NOTE — ED Notes (Signed)
Attempted bed pan, no stool.

## 2016-08-29 NOTE — ED Notes (Signed)
MD at bedside. 

## 2016-08-29 NOTE — ED Triage Notes (Signed)
[facility] [place] [facility] Patient has had an unwitnessed fall while ambulating to the bathroom without assistance. EMS reports she had a walker in the room. Denies hitting head or complaints of neck/back pain. Pt does have hx of dementia and is taking Coumadin. EMS reports finding patient lying on her left side and pt reported to EMS she landed on her knees. Pt is complaining of bilateral knee pain.

## 2016-08-29 NOTE — ED Notes (Signed)
Pt transported to XR.  

## 2016-08-29 NOTE — ED Notes (Signed)
Bed: WA14 Expected date:  Expected time:  Means of arrival:  Comments: EMS-fall 

## 2016-08-29 NOTE — H&P (Signed)
History and Physical    Bianca Wyatt QMV:[id number] DOB: Sep 15, 1934 DOA: 08/30/2016  PCP: Angela Cox, MD  Patient coming from: SNF  Chief Complaint:  fall  HPI: Bianca Wyatt is a 82 y.o. female with medical history significant of dementia, CKD, CHF was going to the bathroom and fell onto her knees tonight causing pain.  She had no head trauma or LOC.  Pt is demented so cannot provide history.  All obtained from ED notes and ED doctor.  Pt found to have bilateral distal femur fractures.  She is suppose to ambulate with assistance at the SNF.  Ortho called and will see pt in am for possible surgical intervention.  Review of Systems: As per HPI otherwise 10 point review of systems negative however grossly unreliable.  Pt verbal and answers questions but is inconsistent and does not really remember what happened.  Past Medical History:  Diagnosis Date  . Allergy   . Anemia   . Arthritis   . Chronic diastolic (congestive) heart failure   . Chronic diastolic (congestive) heart failure   . CKD (chronic kidney disease), stage III   . Colitis   . Dementia   . Depression   . Diabetes mellitus without complication (HCC)   . GERD (gastroesophageal reflux disease)   . Hypercholesteremia   . Hyperlipemia   . Hypertension   . PE (pulmonary embolism)   . Rectal hemorrhage   . Seizures (HCC)   . Sepsis (HCC)   . Syncope     Past Surgical History:  Procedure Laterality Date  . exploratory lap for sbo  2012     reports that she has never smoked. She has never used smokeless tobacco. She reports that she does not drink alcohol or use drugs.  No Known Allergies  Family History  Problem Relation Age of Onset  . Heart disease Mother   . Heart disease Father     Prior to Admission medications   Medication Sig Start Date End Date Taking? Authorizing Provider  amLODipine (NORVASC) 10 MG tablet Take 10 mg by mouth daily.   Yes Historical Provider, MD  atorvastatin (LIPITOR)  10 MG tablet Take 10 mg by mouth every evening.    Yes Historical Provider, MD  calcitRIOL (ROCALTROL) 0.25 MCG capsule Take 0.25 mcg by mouth daily.    Yes Historical Provider, MD  carboxymethylcellulose 1 % ophthalmic solution Place 1 drop into both eyes 3 (three) times daily.   Yes Historical Provider, MD  cholecalciferol (VITAMIN D) 1000 units tablet Take 2,000 Units by mouth daily.   Yes Historical Provider, MD  divalproex (DEPAKOTE SPRINKLE) 125 MG capsule Take 250 mg by mouth 3 (three) times daily.   Yes Historical Provider, MD  DULoxetine (CYMBALTA) 60 MG capsule Take 60 mg by mouth daily.   Yes Historical Provider, MD  furosemide (LASIX) 20 MG tablet Take 20 mg by mouth daily.    Yes Historical Provider, MD  insulin aspart (NOVOLOG) 100 UNIT/ML injection Inject 2-8 Units into the skin 3 (three) times daily as needed for high blood sugar. Pt uses as needed per sliding scale:   201-250:  2 units  251-300:  4 units  301-350:  6 units  351-400:  8 units  Greater than 400:  Call MD   Yes Historical Provider, MD  insulin glargine (LANTUS) 100 UNIT/ML injection Inject 0.06 mLs (6 Units total) into the skin at bedtime. 01/14/16  Yes Joseph Art, DO  iron polysaccharides (NIFEREX) 150 MG capsule  Take 150 mg by mouth 2 (two) times daily.   Yes Historical Provider, MD  latanoprost (XALATAN) 0.005 % ophthalmic solution Place 1 drop into both eyes at bedtime.    Yes Historical Provider, MD  lisinopril (PRINIVIL,ZESTRIL) 20 MG tablet Take 20 mg by mouth daily.   Yes Historical Provider, MD  metoprolol tartrate (LOPRESSOR) 25 MG tablet Take 12.5 mg by mouth 2 (two) times daily.    Yes Historical Provider, MD  omeprazole (PRILOSEC) 20 MG capsule Take 20 mg by mouth 2 (two) times daily before a meal.   Yes Historical Provider, MD  oxybutynin (DITROPAN-XL) 10 MG 24 hr tablet Take 10 mg by mouth daily.    Yes Historical Provider, MD  rivastigmine (EXELON) 6 MG capsule Take 6 mg by mouth 2 (two) times  daily.   Yes Historical Provider, MD  warfarin (COUMADIN) 3 MG tablet Take 1.5 mg by mouth every evening.   Yes Historical Provider, MD    Physical Exam: Vitals:   12-Sep-2016 1804 12-Sep-2016 1810 09/12/2016 1824 09/12/2016 2039  BP:   (!) 102/54 99/80  Pulse:   71 70  Resp:   15 15  Temp:   97.7 F (36.5 C)   TempSrc:   Oral   SpO2: 99%  98% 100%  Weight:  94.3 kg (208 lb)    Height:  5\' 2"  (1.575 m)      Constitutional: NAD, calm, comfortable Vitals:   2016-09-12 1804 2016/09/12 1810 2016/09/12 1824 2016-09-12 2039  BP:   (!) 102/54 99/80  Pulse:   71 70  Resp:   15 15  Temp:   97.7 F (36.5 C)   TempSrc:   Oral   SpO2: 99%  98% 100%  Weight:  94.3 kg (208 lb)    Height:  5\' 2"  (1.575 m)     Eyes: PERRL, lids and conjunctivae normal ENMT: Mucous membranes are moist. Posterior pharynx clear of any exudate or lesions.Normal dentition.  Neck: normal, supple, no masses, no thyromegaly Respiratory: clear to auscultation bilaterally, no wheezing, no crackles. Normal respiratory effort. No accessory muscle use.  Cardiovascular: Regular rate and rhythm, no murmurs / rubs / gallops. No extremity edema. 2+ pedal pulses. No carotid bruits.  Abdomen: no tenderness, no masses palpated. No hepatosplenomegaly. Bowel sounds positive.  Musculoskeletal: no clubbing / cyanosis. No joint deformity upper and lower extremities. Good ROM, no contractures. Normal muscle tone.  BLE in long leg splints Skin: no rashes, lesions, ulcers. No induration Neurologic: CN 2-12 grossly intact. Sensation intact, DTR normal. Strength 5/5 in all 4.  Psychiatric: Normal judgment and insight. Alert and oriented x 3. Normal mood.    Labs on Admission: I have personally reviewed following labs and imaging studies  CBC:  Recent Labs Lab 2016-09-12 1942  WBC 12.9*  NEUTROABS 10.4*  HGB 11.1*  HCT 35.0*  MCV 89.1  PLT 277   Basic Metabolic Panel:  Recent Labs Lab 2016/09/12 1942  NA 140  K 4.4  CL 107  CO2 23    GLUCOSE 147*  BUN 40*  CREATININE 2.95*  CALCIUM 10.0   GFR: Estimated Creatinine Clearance: 16.3 mL/min (by C-G formula based on SCr of 2.95 mg/dL (H)).  Urine analysis:    Component Value Date/Time   COLORURINE YELLOW 11/17/2010 1311   APPEARANCEUR CLOUDY (A) 11/17/2010 1311   LABSPEC 1.020 11/17/2010 1311   PHURINE 5.5 11/17/2010 1311   GLUCOSEU NEGATIVE 11/17/2010 1311   HGBUR NEGATIVE 11/17/2010 1311   BILIRUBINUR NEGATIVE 11/17/2010  1311   KETONESUR NEGATIVE 11/17/2010 1311   PROTEINUR NEGATIVE 11/17/2010 1311   UROBILINOGEN 0.2 11/17/2010 1311   NITRITE NEGATIVE 11/17/2010 1311   LEUKOCYTESUR  11/17/2010 1311    NEGATIVE MICROSCOPIC NOT DONE ON URINES WITH NEGATIVE PROTEIN, BLOOD, LEUKOCYTES, NITRITE, OR GLUCOSE <1000 mg/dL.     Radiological Exams on Admission: Dg Chest 2 View  Result Date: 08/20/2016 CLINICAL DATA:  Status post unwitnessed fall. Concern for chest injury. Initial encounter. EXAM: CHEST  2 VIEW COMPARISON:  Chest radiograph performed 01/14/2011 FINDINGS: The lungs are well-aerated and clear. There is no evidence of focal opacification, pleural effusion or pneumothorax. The heart is borderline enlarged. No acute osseous abnormalities are seen. Mild degenerative change is noted at the glenohumeral joints bilaterally. IMPRESSION: Borderline cardiomegaly. Lungs remain grossly clear. No displaced rib fracture seen. Electronically Signed   By: Roanna Raider M.D.   On: 08/30/2016 20:06   Dg Knee 2 Views Left  Result Date: 09/02/2016 CLINICAL DATA:  Status post unwitnessed fall, with left knee pain. Initial encounter. EXAM: LEFT KNEE - 1-2 VIEW COMPARISON:  Left knee radiographs performed 03/12/2009 FINDINGS: There is a near horizontal fracture through the distal femoral metaphysis, extending to the femoral portion of the patient's prosthesis, with mild posterior displacement and rotation of the distal fragment. The tibial component appears grossly intact. There  is no evidence of loosening. The patellar component is not well assessed on radiograph. No significant joint effusion is seen. The visualized soft tissues are grossly unremarkable in appearance. IMPRESSION: Near horizontal fracture through the distal femoral metaphysis, extending to the femoral portion of the patient's prosthesis, with mild posterior displacement and rotation of the distal fragment. Electronically Signed   By: Roanna Raider M.D.   On: 09/05/2016 20:10   Dg Knee 2 Views Right  Result Date: 09/02/2016 CLINICAL DATA:  Status post unwitnessed fall, with right knee pain. Initial encounter. EXAM: RIGHT KNEE - 1-2 VIEW COMPARISON:  None. FINDINGS: There is a displaced oblique fracture extending through the distal femoral metaphysis, extending to the femoral portion of the patient's prosthesis. Approximately 1/2 shaft width posterior and medial displacement and slight shortening is noted. No significant knee joint effusion is seen. The tibial component is unremarkable in appearance. The patellar component is not well assessed on radiograph. IMPRESSION: Displaced oblique fracture extending through the distal femoral metaphysis, extending to the femoral portion of the patient's prosthesis. 1/2 shaft width posterior and medial displacement and slight shortening noted at the fracture site. Electronically Signed   By: Roanna Raider M.D.   On: 09/09/2016 20:08   Ct Head Wo Contrast  Result Date: 09/15/2016 CLINICAL DATA:  Status post unwitnessed fall, with concern for head injury. Patient on Coumadin. Initial encounter. EXAM: CT HEAD WITHOUT CONTRAST TECHNIQUE: Contiguous axial images were obtained from the base of the skull through the vertex without intravenous contrast. COMPARISON:  MRI of the brain performed 01/19/2011, and CT of the head performed 01/14/2011 FINDINGS: Brain: No evidence of acute infarction, hemorrhage, hydrocephalus, extra-axial collection or mass lesion/mass effect. Prominence  of the ventricles and sulci reflects moderate cortical volume loss. Diffuse periventricular and subcortical white matter change likely reflects small vessel ischemic microangiopathy. Mild cerebellar atrophy is noted. The brainstem and fourth ventricle are within normal limits. The basal ganglia are unremarkable in appearance. The cerebral hemispheres demonstrate grossly normal gray-white differentiation. No mass effect or midline shift is seen. Vascular: No hyperdense vessel or unexpected calcification. Skull: There is no evidence of fracture; visualized osseous structures  are unremarkable in appearance. Sinuses/Orbits: The abdominal aorta is unremarkable in appearance. The inferior vena cava is grossly unremarkable. No retroperitoneal lymphadenopathy is seen. No pelvic sidewall lymphadenopathy is identified. Other: No significant soft tissue abnormalities are seen. IMPRESSION: 1. No evidence of traumatic intracranial injury or fracture. 2. Moderate cortical volume loss and diffuse small vessel ischemic microangiopathy. Electronically Signed   By: Roanna RaiderJeffery  Chang M.D.   On: 2016/01/24 20:11   Dg Hip Unilat W Or Wo Pelvis 2-3 Views Right  Result Date: 2016/01/24 CLINICAL DATA:  Nursing home patient with unwitnessed fall EXAM: DG HIP (WITH OR WITHOUT PELVIS) 2-3V RIGHT COMPARISON:  CT scan 01/12/2016 FINDINGS: No definite acute displaced fracture or dislocation. Mild degenerative changes of both hips. Pubic symphysis appears intact. Mild SI joint arthritic change. IMPRESSION: No radiographic evidence for acute osseous abnormality Electronically Signed   By: Jasmine PangKim  Fujinaga M.D.   On: 2016/01/24 20:06    Assessment/Plan 82 yo female with mechanical fall with bilateral femur fractures  Principal Problem:   Femur fracture, right (HCC)-  Will keep npo after midnight.  Ortho to see in am to evaluate  If needs surgery or not.     Active Problems:   Femur fracture, left (HCC)- as above   Pulmonary embolism  (HCC)- on coumadin, INR pending   Chronic kidney disease-  Cr 2.9 baseline around 2.  Gentle ivf overnight   Anemia in chronic renal disease- stable   Dementia without behavioral disturbance   Chronic diastolic congestive heart failure (HCC)- compensated and stable, holding lasix overnight      DVT prophylaxis:   On coumadin, INR pending Code Status:  Full Family Communication:  none Disposition Plan:   Per day team Consults called:   Orthopedic team, dr Magnus Ivanblackman to see in am, dr Ophelia Charteryates called by EDP Admission status:  Obs, change to inpatient if needs surgery in the am   Rex Magee A MD [facility]  If 7PM-7AM, please contact night-coverage [email or web address] Password [email or web address]  2016/01/24, 9:59 PM

## 2016-08-29 NOTE — ED Provider Notes (Signed)
Emergency Department Provider Note   I have reviewed the triage vital signs and the nursing notes.   HISTORY  Chief Complaint Fall   HPI Bianca Wyatt is a 82 y.o. female with PMH of CKD, CHF, GERD, DM, HTN, HLD, and PE on coumadin presents to the ED for evaluation of unwitnessed fall while ambulating from the bathroom. The patient has dementia and is unable to tell me exactly what happened. She says that she was on the commode and "my knees started shaking" prior to falling. She denies any pain in her chest, difficulty breathing, heart palpitations. She's complaining mostly of bilateral knee pain. She does not believe she struck her head. The fall was unwitnessed by staff she was found lying on her left side in the floor.    Past Medical History:  Diagnosis Date  . Allergy   . Anemia   . Arthritis   . Chronic diastolic (congestive) heart failure   . Chronic diastolic (congestive) heart failure   . CKD (chronic kidney disease), stage III   . Colitis   . Dementia   . Depression   . Diabetes mellitus without complication (HCC)   . GERD (gastroesophageal reflux disease)   . Hypercholesteremia   . Hyperlipemia   . Hypertension   . PE (pulmonary embolism)   . Rectal hemorrhage   . Seizures (HCC)   . Sepsis (HCC)   . Syncope     Patient Active Problem List   Diagnosis Date Noted  . Femur fracture, right (HCC) 08/22/2016  . Femur fracture, left (HCC) 08/30/2016  . Closed femur fracture (HCC) 08/30/2016  . Colitis 01/12/2016  . Sepsis (HCC) 01/12/2016  . Rectal bleeding 01/12/2016  . HLD (hyperlipidemia) 01/12/2016  . CKD (chronic kidney disease), stage III 01/12/2016  . Chronic diastolic congestive heart failure (HCC)   . Warfarin-induced coagulopathy (HCC)   . Diabetes (HCC) 09/12/2014  . Type II or unspecified type diabetes mellitus with renal manifestations, not stated as uncontrolled(250.40) 05/28/2014  . Osteoarthritis 03/16/2014  . Edema 03/16/2014  .  Pulmonary embolism (HCC) 03/16/2014  . Dementia without behavioral disturbance 03/16/2014  . Depression 03/16/2014  . Chronic kidney disease 09/25/2013  . Anemia in chronic renal disease 09/25/2013  . Type II or unspecified type diabetes mellitus with renal manifestations, uncontrolled(250.42) 09/23/2013  . Esophageal reflux 08/04/2013  . Urinary urgency 07/12/2013  . Benign renovascular hypertension 04/21/2013  . Pure hypercholesterolemia 04/01/2013  . Allergic rhinitis, cause unspecified 04/01/2013    Past Surgical History:  Procedure Laterality Date  . exploratory lap for sbo  2012      Allergies Review of patient's allergies indicates no known allergies.  Family History  Problem Relation Age of Onset  . Heart disease Mother   . Heart disease Father     Social History Social History  Substance Use Topics  . Smoking status: Never Smoker  . Smokeless tobacco: Never Used  . Alcohol use No    Review of Systems  Constitutional: No fever/chills Eyes: No visual changes. ENT: No sore throat. Cardiovascular: Denies chest pain. Respiratory: Denies shortness of breath. Gastrointestinal: No abdominal pain. No nausea, no vomiting. No diarrhea. No constipation. Musculoskeletal: Negative for back pain. Bilateral knee pain.  Skin: Negative for rash. Neurological: Negative for headaches, focal weakness or numbness.  10-point ROS otherwise negative.  ____________________________________________   PHYSICAL EXAM:  VITAL SIGNS: ED Triage Vitals  Enc Vitals Group     BP 09/11/2016 1824 (!) 102/54     Pulse  Rate 09-07-16 1824 71     Resp 07-Sep-2016 1824 15     Temp 2016/09/07 1824 97.7 F (36.5 C)     Temp Source 2016/09/07 1824 Oral     SpO2 07-Sep-2016 1804 99 %     Weight 09/07/16 1810 208 lb (94.3 kg)     Height 09-07-2016 1810 5\' 2"  (1.575 m)   Constitutional: Alert. Well appearing and in no acute distress. Eyes: Conjunctivae are normal. PERRL. Head: Atraumatic. Nose: No  congestion/rhinnorhea. Mouth/Throat: Mucous membranes are moist.  Oropharynx non-erythematous. Neck: No stridor.  No cervical spine tenderness to palpation. Cardiovascular: Normal rate, regular rhythm. Good peripheral circulation. Grossly normal heart sounds.   Respiratory: Normal respiratory effort.  No retractions. Lungs CTAB. Gastrointestinal: Soft and nontender. No distention.  Musculoskeletal: Tenderness to palpation of bilateral knees R > L. Pain with ROM of the right hip.  Neurologic:  Normal speech and language. No gross focal neurologic deficits are appreciated.  Skin:  Skin is warm, dry and intact. No rash noted. Psychiatric: Mood and affect are normal. Speech and behavior are normal.  ____________________________________________   LABS (all labs ordered are listed, but only abnormal results are displayed)  Labs Reviewed  BASIC METABOLIC PANEL - Abnormal; Notable for the following:       Result Value   Glucose, Bld 147 (*)    BUN 40 (*)    Creatinine, Ser 2.95 (*)    GFR calc non Af Amer 14 (*)    GFR calc Af Amer 16 (*)    All other components within normal limits  CBC WITH DIFFERENTIAL/PLATELET - Abnormal; Notable for the following:    WBC 12.9 (*)    Hemoglobin 11.1 (*)    HCT 35.0 (*)    Neutro Abs 10.4 (*)    All other components within normal limits  PROTIME-INR - Abnormal; Notable for the following:    Prothrombin Time 20.9 (*)    All other components within normal limits  BASIC METABOLIC PANEL - Abnormal; Notable for the following:    Glucose, Bld 117 (*)    BUN 40 (*)    Creatinine, Ser 3.01 (*)    GFR calc non Af Amer 14 (*)    GFR calc Af Amer 16 (*)    All other components within normal limits  CBC - Abnormal; Notable for the following:    RBC 3.65 (*)    Hemoglobin 10.3 (*)    HCT 31.2 (*)    All other components within normal limits  PROTIME-INR - Abnormal; Notable for the following:    Prothrombin Time 23.1 (*)    All other components within  normal limits  GLUCOSE, CAPILLARY - Abnormal; Notable for the following:    Glucose-Capillary 144 (*)    All other components within normal limits  GLUCOSE, CAPILLARY  PROTIME-INR  I-STAT TROPOININ, ED   ____________________________________________  EKG   EKG Interpretation  Date/Time:  Tuesday 09-07-2016 18:43:19 EDT Ventricular Rate:  71 PR Interval:    QRS Duration: 118 QT Interval:  430 QTC Calculation: 468 R Axis:   -10 Text Interpretation:  Sinus rhythm Nonspecific intraventricular conduction delay Anteroseptal infarct, old No STEMI. New T wave inversions inferiorly.  Confirmed by LONG MD, JOSHUA [phone]) on Sep 07, 2016 6:46:28 PM       ____________________________________________  RADIOLOGY  Dg Chest 2 View  Result Date: 2016-09-07 CLINICAL DATA:  Status post unwitnessed fall. Concern for chest injury. Initial encounter. EXAM: CHEST  2 VIEW COMPARISON:  Chest radiograph performed 01/14/2011 FINDINGS: The lungs are well-aerated and clear. There is no evidence of focal opacification, pleural effusion or pneumothorax. The heart is borderline enlarged. No acute osseous abnormalities are seen. Mild degenerative change is noted at the glenohumeral joints bilaterally. IMPRESSION: Borderline cardiomegaly. Lungs remain grossly clear. No displaced rib fracture seen. Electronically Signed   By: Roanna Raider M.D.   On: 09/04/2016 20:06   Dg Knee 2 Views Left  Result Date: 09/04/16 CLINICAL DATA:  Status post unwitnessed fall, with left knee pain. Initial encounter. EXAM: LEFT KNEE - 1-2 VIEW COMPARISON:  Left knee radiographs performed 03/12/2009 FINDINGS: There is a near horizontal fracture through the distal femoral metaphysis, extending to the femoral portion of the patient's prosthesis, with mild posterior displacement and rotation of the distal fragment. The tibial component appears grossly intact. There is no evidence of loosening. The patellar component is not well  assessed on radiograph. No significant joint effusion is seen. The visualized soft tissues are grossly unremarkable in appearance. IMPRESSION: Near horizontal fracture through the distal femoral metaphysis, extending to the femoral portion of the patient's prosthesis, with mild posterior displacement and rotation of the distal fragment. Electronically Signed   By: Roanna Raider M.D.   On: September 04, 2016 20:10   Dg Knee 2 Views Right  Result Date: 09/04/16 CLINICAL DATA:  Status post unwitnessed fall, with right knee pain. Initial encounter. EXAM: RIGHT KNEE - 1-2 VIEW COMPARISON:  None. FINDINGS: There is a displaced oblique fracture extending through the distal femoral metaphysis, extending to the femoral portion of the patient's prosthesis. Approximately 1/2 shaft width posterior and medial displacement and slight shortening is noted. No significant knee joint effusion is seen. The tibial component is unremarkable in appearance. The patellar component is not well assessed on radiograph. IMPRESSION: Displaced oblique fracture extending through the distal femoral metaphysis, extending to the femoral portion of the patient's prosthesis. 1/2 shaft width posterior and medial displacement and slight shortening noted at the fracture site. Electronically Signed   By: Roanna Raider M.D.   On: 2016-09-04 20:08   Ct Head Wo Contrast  Result Date: 09/04/2016 CLINICAL DATA:  Status post unwitnessed fall, with concern for head injury. Patient on Coumadin. Initial encounter. EXAM: CT HEAD WITHOUT CONTRAST TECHNIQUE: Contiguous axial images were obtained from the base of the skull through the vertex without intravenous contrast. COMPARISON:  MRI of the brain performed 01/19/2011, and CT of the head performed 01/14/2011 FINDINGS: Brain: No evidence of acute infarction, hemorrhage, hydrocephalus, extra-axial collection or mass lesion/mass effect. Prominence of the ventricles and sulci reflects moderate cortical volume  loss. Diffuse periventricular and subcortical white matter change likely reflects small vessel ischemic microangiopathy. Mild cerebellar atrophy is noted. The brainstem and fourth ventricle are within normal limits. The basal ganglia are unremarkable in appearance. The cerebral hemispheres demonstrate grossly normal gray-white differentiation. No mass effect or midline shift is seen. Vascular: No hyperdense vessel or unexpected calcification. Skull: There is no evidence of fracture; visualized osseous structures are unremarkable in appearance. Sinuses/Orbits: The abdominal aorta is unremarkable in appearance. The inferior vena cava is grossly unremarkable. No retroperitoneal lymphadenopathy is seen. No pelvic sidewall lymphadenopathy is identified. Other: No significant soft tissue abnormalities are seen. IMPRESSION: 1. No evidence of traumatic intracranial injury or fracture. 2. Moderate cortical volume loss and diffuse small vessel ischemic microangiopathy. Electronically Signed   By: Roanna Raider M.D.   On: 09/04/16 20:11   Dg Hip Unilat W Or Wo Pelvis 2-3 Views Right  Result Date: 12-07-15 CLINICAL DATA:  Nursing home patient with unwitnessed fall EXAM: DG HIP (WITH OR WITHOUT PELVIS) 2-3V RIGHT COMPARISON:  CT scan 01/12/2016 FINDINGS: No definite acute displaced fracture or dislocation. Mild degenerative changes of both hips. Pubic symphysis appears intact. Mild SI joint arthritic change. IMPRESSION: No radiographic evidence for acute osseous abnormality Electronically Signed   By: Jasmine PangKim  Fujinaga M.D.   On: 12-07-15 20:06    ____________________________________________   PROCEDURES  Procedure(s) performed:   Procedures  None ____________________________________________   INITIAL IMPRESSION / ASSESSMENT AND PLAN / ED COURSE  Pertinent labs & imaging results that were available during my care of the patient were reviewed by me and considered in my medical decision making (see chart  for details).  Patient presents to the emergency department for evaluation of unwitnessed fall presumably while ambulating unattended. She has baseline dementia which limits the HPI and ROS. She is on Coumadin presumably for pulmonary embolus. She's primary really complaining of bilateral knee pain. No evidence of head or neck trauma. She has tenderness to palpation of both knees and pain with range of motion of the right hip. Plan to obtain CT scan of the head, chest x-ray, bilateral knees, right hip. Plan to obtain baseline labs and EKG with unwitnessed fall but is likely mechanical.   Patient with prior surgery with Dr. Janee Mornhompson in 2010. Will page [facility].   09:57 PM Spoke with Dr. Ophelia CharterYates regarding the patient. Plan for bilateral log-leg splint with 30-40 degrees of flexion at the knee. Dr. Rayburn MaBlackmon will see in the AM. Patient is possibly a surgical candidate but will likely not go to the OR tomorrow. Ok to eat tonight.   Discussed patient's case with hospitalist, Dr. Onalee Huaavid.  Recommend admission to med-surg, obs bed.  I will place holding orders per their request. Patient and family (if present) updated with plan. Care transferred to hospitalist service.  I reviewed all nursing notes, vitals, pertinent old records, EKGs, labs, imaging (as available).  ____________________________________________  FINAL CLINICAL IMPRESSION(S) / ED DIAGNOSES  Final diagnoses:  Closed fracture of distal end of femur, unspecified fracture morphology, unspecified laterality, initial encounter (HCC)     MEDICATIONS GIVEN DURING THIS VISIT:  Medications  oxybutynin (DITROPAN-XL) 24 hr tablet 10 mg (10 mg Oral Given 08/30/16 0918)  amLODipine (NORVASC) tablet 10 mg (10 mg Oral Not Given 08/30/16 0918)  atorvastatin (LIPITOR) tablet 10 mg (not administered)  iron polysaccharides (NIFEREX) capsule 150 mg (150 mg Oral Given 08/30/16 0918)  rivastigmine (EXELON) capsule 6 mg (6 mg Oral Given 08/30/16  0918)  insulin aspart (novoLOG) injection 2-8 Units (not administered)  calcitRIOL (ROCALTROL) capsule 0.25 mcg (0.25 mcg Oral Given 08/30/16 0918)  latanoprost (XALATAN) 0.005 % ophthalmic solution 1 drop (1 drop Both Eyes Given Dec 26, 2015 2345)  metoprolol tartrate (LOPRESSOR) tablet 12.5 mg (12.5 mg Oral Given 08/30/16 0918)  insulin glargine (LANTUS) injection 6 Units (6 Units Subcutaneous Given Dec 26, 2015 2345)  cholecalciferol (VITAMIN D) tablet 2,000 Units (2,000 Units Oral Given 08/30/16 0918)  DULoxetine (CYMBALTA) DR capsule 60 mg (60 mg Oral Given 08/30/16 0918)  polyvinyl alcohol (LIQUIFILM TEARS) 1.4 % ophthalmic solution 1 drop (1 drop Both Eyes Given 08/30/16 0919)  divalproex (DEPAKOTE SPRINKLE) capsule 250 mg (250 mg Oral Given 08/30/16 0918)  lisinopril (PRINIVIL,ZESTRIL) tablet 20 mg (20 mg Oral Not Given 08/30/16 0918)  0.9 %  sodium chloride infusion ( Intravenous New Bag/Given 08/30/16 1143)  oxyCODONE (Oxy IR/ROXICODONE) immediate release tablet 5 mg (5 mg  Oral Given 08/30/16 0918)  ondansetron (ZOFRAN) tablet 4 mg (not administered)    Or  ondansetron (ZOFRAN) injection 4 mg (not administered)  morphine 4 MG/ML injection 4 mg (4 mg Intravenous Given 08/30/16 0313)  enoxaparin (LOVENOX) injection 30 mg (not administered)  acetaminophen (TYLENOL) tablet 1,000 mg (1,000 mg Oral Given 09-20-2016 1950)    Note:  This document was prepared using Dragon voice recognition software and may include unintentional dictation errors.  Alona Bene, MD Emergency Medicine   Maia Plan, MD 08/30/16 [phone]

## 2016-08-30 ENCOUNTER — Observation Stay (HOSPITAL_COMMUNITY): Payer: Medicare Other

## 2016-08-30 DIAGNOSIS — K219 Gastro-esophageal reflux disease without esophagitis: Secondary | ICD-10-CM | POA: Diagnosis present

## 2016-08-30 DIAGNOSIS — Z96653 Presence of artificial knee joint, bilateral: Secondary | ICD-10-CM | POA: Diagnosis present

## 2016-08-30 DIAGNOSIS — Z79899 Other long term (current) drug therapy: Secondary | ICD-10-CM | POA: Diagnosis not present

## 2016-08-30 DIAGNOSIS — Z7901 Long term (current) use of anticoagulants: Secondary | ICD-10-CM | POA: Diagnosis not present

## 2016-08-30 DIAGNOSIS — E1122 Type 2 diabetes mellitus with diabetic chronic kidney disease: Secondary | ICD-10-CM | POA: Diagnosis present

## 2016-08-30 DIAGNOSIS — N183 Chronic kidney disease, stage 3 (moderate): Secondary | ICD-10-CM | POA: Diagnosis present

## 2016-08-30 DIAGNOSIS — Z8249 Family history of ischemic heart disease and other diseases of the circulatory system: Secondary | ICD-10-CM | POA: Diagnosis not present

## 2016-08-30 DIAGNOSIS — F0390 Unspecified dementia without behavioral disturbance: Secondary | ICD-10-CM | POA: Diagnosis present

## 2016-08-30 DIAGNOSIS — Z794 Long term (current) use of insulin: Secondary | ICD-10-CM | POA: Diagnosis not present

## 2016-08-30 DIAGNOSIS — D631 Anemia in chronic kidney disease: Secondary | ICD-10-CM | POA: Diagnosis present

## 2016-08-30 DIAGNOSIS — S72491A Other fracture of lower end of right femur, initial encounter for closed fracture: Secondary | ICD-10-CM | POA: Diagnosis present

## 2016-08-30 DIAGNOSIS — E785 Hyperlipidemia, unspecified: Secondary | ICD-10-CM | POA: Diagnosis present

## 2016-08-30 DIAGNOSIS — Z86718 Personal history of other venous thrombosis and embolism: Secondary | ICD-10-CM | POA: Diagnosis not present

## 2016-08-30 DIAGNOSIS — S72001E Fracture of unspecified part of neck of right femur, subsequent encounter for open fracture type I or II with routine healing: Secondary | ICD-10-CM

## 2016-08-30 DIAGNOSIS — T791XXA Fat embolism (traumatic), initial encounter: Secondary | ICD-10-CM | POA: Diagnosis not present

## 2016-08-30 DIAGNOSIS — W1830XA Fall on same level, unspecified, initial encounter: Secondary | ICD-10-CM | POA: Diagnosis present

## 2016-08-30 DIAGNOSIS — X58XXXA Exposure to other specified factors, initial encounter: Secondary | ICD-10-CM | POA: Diagnosis not present

## 2016-08-30 DIAGNOSIS — S72001A Fracture of unspecified part of neck of right femur, initial encounter for closed fracture: Secondary | ICD-10-CM | POA: Diagnosis not present

## 2016-08-30 DIAGNOSIS — M25562 Pain in left knee: Secondary | ICD-10-CM | POA: Diagnosis present

## 2016-08-30 DIAGNOSIS — Y92129 Unspecified place in nursing home as the place of occurrence of the external cause: Secondary | ICD-10-CM | POA: Diagnosis not present

## 2016-08-30 DIAGNOSIS — J9690 Respiratory failure, unspecified, unspecified whether with hypoxia or hypercapnia: Secondary | ICD-10-CM | POA: Diagnosis not present

## 2016-08-30 DIAGNOSIS — R7989 Other specified abnormal findings of blood chemistry: Secondary | ICD-10-CM | POA: Diagnosis present

## 2016-08-30 DIAGNOSIS — S72492A Other fracture of lower end of left femur, initial encounter for closed fracture: Secondary | ICD-10-CM | POA: Diagnosis present

## 2016-08-30 DIAGNOSIS — I130 Hypertensive heart and chronic kidney disease with heart failure and stage 1 through stage 4 chronic kidney disease, or unspecified chronic kidney disease: Secondary | ICD-10-CM | POA: Diagnosis present

## 2016-08-30 DIAGNOSIS — I5032 Chronic diastolic (congestive) heart failure: Secondary | ICD-10-CM | POA: Diagnosis present

## 2016-08-30 LAB — CBC
HEMATOCRIT: 31.2 % — AB (ref 36.0–46.0)
HEMOGLOBIN: 10.3 g/dL — AB (ref 12.0–15.0)
MCH: 28.2 pg (ref 26.0–34.0)
MCHC: 33 g/dL (ref 30.0–36.0)
MCV: 85.5 fL (ref 78.0–100.0)
Platelets: 173 10*3/uL (ref 150–400)
RBC: 3.65 MIL/uL — AB (ref 3.87–5.11)
RDW: 15.2 % (ref 11.5–15.5)
WBC: 10.5 10*3/uL (ref 4.0–10.5)

## 2016-08-30 LAB — BASIC METABOLIC PANEL
ANION GAP: 11 (ref 5–15)
BUN: 40 mg/dL — ABNORMAL HIGH (ref 6–20)
CALCIUM: 9.7 mg/dL (ref 8.9–10.3)
CO2: 22 mmol/L (ref 22–32)
Chloride: 108 mmol/L (ref 101–111)
Creatinine, Ser: 3.01 mg/dL — ABNORMAL HIGH (ref 0.44–1.00)
GFR, EST AFRICAN AMERICAN: 16 mL/min — AB (ref >60)
GFR, EST NON AFRICAN AMERICAN: 14 mL/min — AB (ref >60)
Glucose, Bld: 117 mg/dL — ABNORMAL HIGH (ref 65–99)
POTASSIUM: 4.9 mmol/L (ref 3.5–5.1)
Sodium: 141 mmol/L (ref 135–145)

## 2016-08-30 LAB — PROTIME-INR
INR: 2.01
INR: 2.3
PROTHROMBIN TIME: 23.1 s — AB (ref 11.4–15.2)
Prothrombin Time: 25.7 seconds — ABNORMAL HIGH (ref 11.4–15.2)

## 2016-08-30 LAB — GLUCOSE, CAPILLARY
GLUCOSE-CAPILLARY: 87 mg/dL (ref 65–99)
GLUCOSE-CAPILLARY: 101 mg/dL — AB (ref 65–99)
GLUCOSE-CAPILLARY: 99 mg/dL (ref 65–99)
Glucose-Capillary: 144 mg/dL — ABNORMAL HIGH (ref 65–99)
Glucose-Capillary: 91 mg/dL (ref 65–99)

## 2016-08-30 MED ORDER — MORPHINE SULFATE (PF) 4 MG/ML IV SOLN
4.0000 mg | INTRAVENOUS | Status: DC | PRN
  Administered 2016-08-30: 2 mg via INTRAVENOUS
  Administered 2016-08-30: 4 mg via INTRAVENOUS
  Administered 2016-08-30: 2 mg via INTRAVENOUS
  Administered 2016-08-30 – 2016-08-31 (×4): 4 mg via INTRAVENOUS
  Filled 2016-08-30 (×7): qty 1

## 2016-08-30 MED ORDER — ENOXAPARIN SODIUM 30 MG/0.3ML ~~LOC~~ SOLN: 30.0000 mg | SUBCUTANEOUS | Status: DC

## 2016-08-30 NOTE — Progress Notes (Signed)
PROGRESS NOTE    Bianca Wyatt  RUE:[id number] DOB: Feb 17, 1934 DOA: 08/21/2016 PCP: Angela Cox, MD    Brief Narrative:     Assessment & Plan:   Principal Problem:   Femur fracture, right (HCC)  Closed femur fracture (HCC) Femur fracture, left (HCC) -Orthopedic surgery on board - Continue supportive therapy - Physical therapy once able  Active Problems:   Chronic kidney disease - Serum creatinine elevated above baseline levels. - Suspect baseline serum creatinine of about 1.8 as such will hold ACE inhibitor    Anemia in chronic renal disease - Stable currently    Pulmonary embolism (HCC) - Patient is currently on Lovenox    Dementia without behavioral disturbance -Continue prior to admission home medication regimen.    Chronic diastolic congestive heart failure (HCC)  - As mentioned above will hold ACE inhibitor secondary to elevated serum creatinine. Otherwise continue beta blocker. Currently appears compensated   DVT prophylaxis: Lovenox Code Status: Full Family Communication: None at bedside Disposition Plan: Pending plans by specialist   Consultants:   Orthopedic surgery   Procedures: None   Antimicrobials: None   Subjective: Patient has no new complaints. No acute issues overnight  Objective: Vitals:   08/26/2016 2327 08/30/16 0544 08/30/16 0918 08/30/16 1415  BP: 135/78 127/69 (!) 104/58 (!) 130/55  Pulse: 78 85 78 73  Resp: 20 18  15   Temp: 97.5 F (36.4 C) 97.4 F (36.3 C)  98.4 F (36.9 C)  TempSrc: Oral Oral  Oral  SpO2: 100% 98%  98%  Weight: 81.3 kg (179 lb 3.7 oz)     Height: 5\' 2"  (1.575 m)       Intake/Output Summary (Last 24 hours) at 08/30/16 1424 Last data filed at 08/30/16 1315  Gross per 24 hour  Intake          1368.75 ml  Output              425 ml  Net           943.75 ml   Filed Weights   09/05/2016 1810 09/18/2016 2327  Weight: 94.3 kg (208 lb) 81.3 kg (179 lb 3.7 oz)    Examination:  General exam:  Appears calm and comfortable , In no acute distress Respiratory system: Clear to auscultation. Respiratory effort normal. Cardiovascular system: S1 & S2 heard, RRR. No JVD, murmurs, rubs, gallops or clicks.  Gastrointestinal system: Abdomen is nondistended, soft and nontender. No organomegaly or masses felt. Normal bowel sounds heard. Central nervous system: Alert and awake, sensation to light touch intact Extremities: Equal tone, distal pulses present Skin: No rashes, lesions or ulcers Psychiatry: Unable to accurately assess secondary to dementia   imaging studies  CBC:  Recent Labs Lab 08/26/2016 1942 08/30/16 0438  WBC 12.9* 10.5  NEUTROABS 10.4*  --   HGB 11.1* 10.3*  HCT 35.0* 31.2*  MCV 89.1 85.5  PLT 277 173   Basic Metabolic Panel:  Recent Labs Lab 09/09/2016 1942 08/30/16 0438  NA 140 141  K 4.4 4.9  CL 107 108  CO2 23 22  GLUCOSE 147* 117*  BUN 40* 40*  CREATININE 2.95* 3.01*  CALCIUM 10.0 9.7   GFR: Estimated Creatinine Clearance: 14.7 mL/min (by C-G formula based on SCr of 3.01 mg/dL (H)). Liver Function Tests: No results for input(s): AST, ALT, ALKPHOS, BILITOT, PROT, ALBUMIN in the last 168 hours. No results for input(s): LIPASE, AMYLASE in the last 168 hours. No results for input(s): AMMONIA in the last  168 hours. Coagulation Profile:  Recent Labs Lab Apr 16, 2016 1942 08/30/16 0438  INR 1.78 2.01   Cardiac Enzymes: No results for input(s): CKTOTAL, CKMB, CKMBINDEX, TROPONINI in the last 168 hours. BNP (last 3 results) No results for input(s): PROBNP in the last 8760 hours. HbA1C: No results for input(s): HGBA1C in the last 72 hours. CBG:  Recent Labs Lab 08/30/16 0250 08/30/16 0907 08/30/16 1253  GLUCAP 144* 87 101*   Lipid Profile: No results for input(s): CHOL, HDL, LDLCALC, TRIG, CHOLHDL, LDLDIRECT in the last 72 hours. Thyroid Function Tests: No results for input(s): TSH, T4TOTAL, FREET4, T3FREE, THYROIDAB in the last 72  hours. Anemia Panel: No results for input(s): VITAMINB12, FOLATE, FERRITIN, TIBC, IRON, RETICCTPCT in the last 72 hours. Sepsis Labs: No results for input(s): PROCALCITON, LATICACIDVEN in the last 168 hours.  No results found for this or any previous visit (from the past 240 hour(s)).       Radiology Studies: Dg Chest 2 View  Result Date: 01-12-16 CLINICAL DATA:  Status post unwitnessed fall. Concern for chest injury. Initial encounter. EXAM: CHEST  2 VIEW COMPARISON:  Chest radiograph performed 01/14/2011 FINDINGS: The lungs are well-aerated and clear. There is no evidence of focal opacification, pleural effusion or pneumothorax. The heart is borderline enlarged. No acute osseous abnormalities are seen. Mild degenerative change is noted at the glenohumeral joints bilaterally. IMPRESSION: Borderline cardiomegaly. Lungs remain grossly clear. No displaced rib fracture seen. Electronically Signed   By: Roanna RaiderJeffery  Chang M.D.   On: 01-12-16 20:06   Dg Knee 2 Views Left  Result Date: 01-12-16 CLINICAL DATA:  Status post unwitnessed fall, with left knee pain. Initial encounter. EXAM: LEFT KNEE - 1-2 VIEW COMPARISON:  Left knee radiographs performed 03/12/2009 FINDINGS: There is a near horizontal fracture through the distal femoral metaphysis, extending to the femoral portion of the patient's prosthesis, with mild posterior displacement and rotation of the distal fragment. The tibial component appears grossly intact. There is no evidence of loosening. The patellar component is not well assessed on radiograph. No significant joint effusion is seen. The visualized soft tissues are grossly unremarkable in appearance. IMPRESSION: Near horizontal fracture through the distal femoral metaphysis, extending to the femoral portion of the patient's prosthesis, with mild posterior displacement and rotation of the distal fragment. Electronically Signed   By: Roanna RaiderJeffery  Chang M.D.   On: 01-12-16 20:10   Dg Knee  2 Views Right  Result Date: 01-12-16 CLINICAL DATA:  Status post unwitnessed fall, with right knee pain. Initial encounter. EXAM: RIGHT KNEE - 1-2 VIEW COMPARISON:  None. FINDINGS: There is a displaced oblique fracture extending through the distal femoral metaphysis, extending to the femoral portion of the patient's prosthesis. Approximately 1/2 shaft width posterior and medial displacement and slight shortening is noted. No significant knee joint effusion is seen. The tibial component is unremarkable in appearance. The patellar component is not well assessed on radiograph. IMPRESSION: Displaced oblique fracture extending through the distal femoral metaphysis, extending to the femoral portion of the patient's prosthesis. 1/2 shaft width posterior and medial displacement and slight shortening noted at the fracture site. Electronically Signed   By: Roanna RaiderJeffery  Chang M.D.   On: 01-12-16 20:08   Ct Head Wo Contrast  Result Date: 01-12-16 CLINICAL DATA:  Status post unwitnessed fall, with concern for head injury. Patient on Coumadin. Initial encounter. EXAM: CT HEAD WITHOUT CONTRAST TECHNIQUE: Contiguous axial images were obtained from the base of the skull through the vertex without intravenous contrast. COMPARISON:  MRI  of the brain performed 01/19/2011, and CT of the head performed 01/14/2011 FINDINGS: Brain: No evidence of acute infarction, hemorrhage, hydrocephalus, extra-axial collection or mass lesion/mass effect. Prominence of the ventricles and sulci reflects moderate cortical volume loss. Diffuse periventricular and subcortical white matter change likely reflects small vessel ischemic microangiopathy. Mild cerebellar atrophy is noted. The brainstem and fourth ventricle are within normal limits. The basal ganglia are unremarkable in appearance. The cerebral hemispheres demonstrate grossly normal gray-white differentiation. No mass effect or midline shift is seen. Vascular: No hyperdense vessel or  unexpected calcification. Skull: There is no evidence of fracture; visualized osseous structures are unremarkable in appearance. Sinuses/Orbits: The abdominal aorta is unremarkable in appearance. The inferior vena cava is grossly unremarkable. No retroperitoneal lymphadenopathy is seen. No pelvic sidewall lymphadenopathy is identified. Other: No significant soft tissue abnormalities are seen. IMPRESSION: 1. No evidence of traumatic intracranial injury or fracture. 2. Moderate cortical volume loss and diffuse small vessel ischemic microangiopathy. Electronically Signed   By: Roanna Raider M.D.   On: 2016/09/22 20:11   Dg Hip Unilat W Or Wo Pelvis 2-3 Views Right  Result Date: 09/22/2016 CLINICAL DATA:  Nursing home patient with unwitnessed fall EXAM: DG HIP (WITH OR WITHOUT PELVIS) 2-3V RIGHT COMPARISON:  CT scan 01/12/2016 FINDINGS: No definite acute displaced fracture or dislocation. Mild degenerative changes of both hips. Pubic symphysis appears intact. Mild SI joint arthritic change. IMPRESSION: No radiographic evidence for acute osseous abnormality Electronically Signed   By: Jasmine Pang M.D.   On: 09-22-2016 20:06        Scheduled Meds: . amLODipine  10 mg Oral Daily  . atorvastatin  10 mg Oral QPM  . calcitRIOL  0.25 mcg Oral Daily  . cholecalciferol  2,000 Units Oral Daily  . divalproex  250 mg Oral TID  . DULoxetine  60 mg Oral Daily  . enoxaparin (LOVENOX) injection  30 mg Subcutaneous Q24H  . insulin glargine  6 Units Subcutaneous QHS  . iron polysaccharides  150 mg Oral BID  . latanoprost  1 drop Both Eyes QHS  . lisinopril  20 mg Oral Daily  . metoprolol tartrate  12.5 mg Oral BID  . oxybutynin  10 mg Oral Daily  . polyvinyl alcohol  1 drop Both Eyes TID  . rivastigmine  6 mg Oral BID   Continuous Infusions:    LOS: 0 days    Time spent: 35 min    Penny Pia, MD [facility] Pager [phone]  If 7PM-7AM, please contact  night-coverage [email or web address] Password [email or web address] 08/30/2016, 2:24 PM

## 2016-08-30 NOTE — Progress Notes (Signed)
Patient has splints on BLE clean,dry intact wrapped with ace bandages

## 2016-08-30 NOTE — Progress Notes (Signed)
ANTICOAGULATION CONSULT NOTE - Initial Consult  Pharmacy Consult for Warfarin to Lovenox Indication: VTE Prophylaxis  No Known Allergies  Patient Measurements: Height: 5\' 2"  (157.5 cm) Weight: 179 lb 3.7 oz (81.3 kg) IBW/kg (Calculated) : 50.1 Heparin Dosing Weight: 68.21 kg  Vital Signs: Temp: 97.4 F (36.3 C) (10/11 0544) Temp Source: Oral (10/11 0544) BP: 104/58 (10/11 0918) Pulse Rate: 78 (10/11 0918)  Labs:  Recent Labs  04-13-2016 1942 08/30/16 0438  HGB 11.1* 10.3*  HCT 35.0* 31.2*  PLT 277 173  LABPROT 20.9* 23.1*  INR 1.78 2.01  CREATININE 2.95* 3.01*    Estimated Creatinine Clearance: 14.7 mL/min (by C-G formula based on SCr of 3.01 mg/dL (H)).   Medical History: Past Medical History:  Diagnosis Date  . Allergy   . Anemia   . Arthritis   . Chronic diastolic (congestive) heart failure   . Chronic diastolic (congestive) heart failure   . CKD (chronic kidney disease), stage III   . Colitis   . Dementia   . Depression   . Diabetes mellitus without complication (HCC)   . GERD (gastroesophageal reflux disease)   . Hypercholesteremia   . Hyperlipemia   . Hypertension   . PE (pulmonary embolism)   . Rectal hemorrhage   . Seizures (HCC)   . Sepsis (HCC)   . Syncope     Medications:  Scheduled:  . amLODipine  10 mg Oral Daily  . atorvastatin  10 mg Oral QPM  . calcitRIOL  0.25 mcg Oral Daily  . cholecalciferol  2,000 Units Oral Daily  . divalproex  250 mg Oral TID  . DULoxetine  60 mg Oral Daily  . insulin glargine  6 Units Subcutaneous QHS  . iron polysaccharides  150 mg Oral BID  . latanoprost  1 drop Both Eyes QHS  . lisinopril  20 mg Oral Daily  . metoprolol tartrate  12.5 mg Oral BID  . oxybutynin  10 mg Oral Daily  . polyvinyl alcohol  1 drop Both Eyes TID  . rivastigmine  6 mg Oral BID    Assessment: Ms. Cristy FolksBeacham is a 82 YO female presenting with a femur fracture after a mechanical fall. Surgery planned for Friday 10/13.  She is on  Warfarin 1.5 mg qd chronically for hx PE in 2012. Her last dose was 10/9 at 1800. Her INR was subtherapeutic on admission, but within goal range this morning at 2.01. Pharmacy has been consulted to dose lovenox while off Coumadin for surgery.  No active bleeding noted. She has CKD with CrCl ~15 mL/min, so Lovenox will need to be renally adjusted.   Goal of Therapy:  Monitor platelets by anticoagulation protocol: Yes   Plan:  Recheck INR this evening Lovenox 30 mg SQ Q24H when INR <2 Monitor CBC, SCr, and s/sx of bleeding.   Susy FrizzleNicole Jennamarie Goings 08/30/2016,10:17 AM

## 2016-08-30 NOTE — Plan of Care (Signed)
Problem: Activity: Goal: Ability to ambulate and perform ADLs will improve Outcome: Not Met (add Reason) Surgery to be scheduled

## 2016-08-30 NOTE — Consult Note (Signed)
Reason for Consult:  Bilateral distal femur periprosthetic fractures Referring Physician: EDP  Bianca Wyatt is an 82 y.o. female.  HPI:   82 yo female nursing home resident with dementia who sustained a mechanical fall.  Due to pain and the inability to ambulate, she was brought to [facility] and found to have bilateral distal femur fractures just proximal to her bilateral knee replacements.  She was admitted to the [facility] service for medical management and Ortho is consulted to address her complex fractures.  Past Medical History:  Diagnosis Date  . Allergy   . Anemia   . Arthritis   . Chronic diastolic (congestive) heart failure   . Chronic diastolic (congestive) heart failure   . CKD (chronic kidney disease), stage III   . Colitis   . Dementia   . Depression   . Diabetes mellitus without complication (HCC)   . GERD (gastroesophageal reflux disease)   . Hypercholesteremia   . Hyperlipemia   . Hypertension   . PE (pulmonary embolism)   . Rectal hemorrhage   . Seizures (HCC)   . Sepsis (HCC)   . Syncope     Past Surgical History:  Procedure Laterality Date  . exploratory lap for sbo  2012    Family History  Problem Relation Age of Onset  . Heart disease Mother   . Heart disease Father     Social History:  reports that she has never smoked. She has never used smokeless tobacco. She reports that she does not drink alcohol or use drugs.  Allergies: No Known Allergies  Medications: I have reviewed the patient's current medications.  Results for orders placed or performed during the hospital encounter of 09/15/2016 (from the past 48 hour(s))  Basic metabolic panel     Status: Abnormal   Collection Time: 09/05/2016  7:42 PM  Result Value Ref Range   Sodium 140 135 - 145 mmol/L   Potassium 4.4 3.5 - 5.1 mmol/L   Chloride 107 101 - 111 mmol/L   CO2 23 22 - 32 mmol/L   Glucose, Bld 147 (H) 65 - 99 mg/dL   BUN 40 (H) 6 - 20 mg/dL   Creatinine, Ser 5.99 (H)  0.44 - 1.00 mg/dL   Calcium 76.8 8.9 - 23.5 mg/dL   GFR calc non Af Amer 14 (L) >60 mL/min   GFR calc Af Amer 16 (L) >60 mL/min    Comment: (NOTE) The eGFR has been calculated using the CKD EPI equation. This calculation has not been validated in all clinical situations. eGFR's persistently <60 mL/min signify possible Chronic Kidney Disease.    Anion gap 10 5 - 15  CBC with Differential     Status: Abnormal   Collection Time: 08/30/2016  7:42 PM  Result Value Ref Range   WBC 12.9 (H) 4.0 - 10.5 K/uL   RBC 3.93 3.87 - 5.11 MIL/uL   Hemoglobin 11.1 (L) 12.0 - 15.0 g/dL   HCT 77.5 (L) 61.9 - 71.8 %   MCV 89.1 78.0 - 100.0 fL   MCH 28.2 26.0 - 34.0 pg   MCHC 31.7 30.0 - 36.0 g/dL   RDW 56.9 26.9 - 97.8 %   Platelets 277 150 - 400 K/uL   Neutrophils Relative % 81 %   Neutro Abs 10.4 (H) 1.7 - 7.7 K/uL   Lymphocytes Relative 13 %   Lymphs Abs 1.7 0.7 - 4.0 K/uL   Monocytes Relative 6 %   Monocytes Absolute 0.8 0.1 - 1.0 K/uL  Eosinophils Relative 0 %   Eosinophils Absolute 0.0 0.0 - 0.7 K/uL   Basophils Relative 0 %   Basophils Absolute 0.0 0.0 - 0.1 K/uL  Protime-INR     Status: Abnormal   Collection Time: 09/08/2016  7:42 PM  Result Value Ref Range   Prothrombin Time 20.9 (H) 11.4 - 15.2 seconds   INR 1.78   I-stat troponin, ED     Status: None   Collection Time: 08/24/2016  8:20 PM  Result Value Ref Range   Troponin i, poc 0.06 0.00 - 0.08 ng/mL   Comment 3            Comment: Due to the release kinetics of cTnI, a negative result within the first hours of the onset of symptoms does not rule out myocardial infarction with certainty. If myocardial infarction is still suspected, repeat the test at appropriate intervals.   Glucose, capillary     Status: Abnormal   Collection Time: 08/30/16  2:50 AM  Result Value Ref Range   Glucose-Capillary 144 (H) 65 - 99 mg/dL  Basic metabolic panel     Status: Abnormal   Collection Time: 08/30/16  4:38 AM  Result Value Ref Range    Sodium 141 135 - 145 mmol/L   Potassium 4.9 3.5 - 5.1 mmol/L   Chloride 108 101 - 111 mmol/L   CO2 22 22 - 32 mmol/L   Glucose, Bld 117 (H) 65 - 99 mg/dL   BUN 40 (H) 6 - 20 mg/dL   Creatinine, Ser 3.01 (H) 0.44 - 1.00 mg/dL   Calcium 9.7 8.9 - 10.3 mg/dL   GFR calc non Af Amer 14 (L) >60 mL/min   GFR calc Af Amer 16 (L) >60 mL/min    Comment: (NOTE) The eGFR has been calculated using the CKD EPI equation. This calculation has not been validated in all clinical situations. eGFR's persistently <60 mL/min signify possible Chronic Kidney Disease.    Anion gap 11 5 - 15  CBC     Status: Abnormal   Collection Time: 08/30/16  4:38 AM  Result Value Ref Range   WBC 10.5 4.0 - 10.5 K/uL   RBC 3.65 (L) 3.87 - 5.11 MIL/uL   Hemoglobin 10.3 (L) 12.0 - 15.0 g/dL   HCT 31.2 (L) 36.0 - 46.0 %   MCV 85.5 78.0 - 100.0 fL   MCH 28.2 26.0 - 34.0 pg   MCHC 33.0 30.0 - 36.0 g/dL   RDW 15.2 11.5 - 15.5 %   Platelets 173 150 - 400 K/uL  Protime-INR     Status: Abnormal   Collection Time: 08/30/16  4:38 AM  Result Value Ref Range   Prothrombin Time 23.1 (H) 11.4 - 15.2 seconds   INR 2.01     Dg Chest 2 View  Result Date: 09/10/2016 CLINICAL DATA:  Status post unwitnessed fall. Concern for chest injury. Initial encounter. EXAM: CHEST  2 VIEW COMPARISON:  Chest radiograph performed 01/14/2011 FINDINGS: The lungs are well-aerated and clear. There is no evidence of focal opacification, pleural effusion or pneumothorax. The heart is borderline enlarged. No acute osseous abnormalities are seen. Mild degenerative change is noted at the glenohumeral joints bilaterally. IMPRESSION: Borderline cardiomegaly. Lungs remain grossly clear. No displaced rib fracture seen. Electronically Signed   By: Garald Balding M.D.   On: 09/02/2016 20:06   Dg Knee 2 Views Left  Result Date: 08/22/2016 CLINICAL DATA:  Status post unwitnessed fall, with left knee pain. Initial encounter. EXAM: LEFT KNEE -  1-2 VIEW COMPARISON:   Left knee radiographs performed 03/12/2009 FINDINGS: There is a near horizontal fracture through the distal femoral metaphysis, extending to the femoral portion of the patient's prosthesis, with mild posterior displacement and rotation of the distal fragment. The tibial component appears grossly intact. There is no evidence of loosening. The patellar component is not well assessed on radiograph. No significant joint effusion is seen. The visualized soft tissues are grossly unremarkable in appearance. IMPRESSION: Near horizontal fracture through the distal femoral metaphysis, extending to the femoral portion of the patient's prosthesis, with mild posterior displacement and rotation of the distal fragment. Electronically Signed   By: Garald Balding M.D.   On: 09/10/2016 20:10   Dg Knee 2 Views Right  Result Date: 09/01/2016 CLINICAL DATA:  Status post unwitnessed fall, with right knee pain. Initial encounter. EXAM: RIGHT KNEE - 1-2 VIEW COMPARISON:  None. FINDINGS: There is a displaced oblique fracture extending through the distal femoral metaphysis, extending to the femoral portion of the patient's prosthesis. Approximately 1/2 shaft width posterior and medial displacement and slight shortening is noted. No significant knee joint effusion is seen. The tibial component is unremarkable in appearance. The patellar component is not well assessed on radiograph. IMPRESSION: Displaced oblique fracture extending through the distal femoral metaphysis, extending to the femoral portion of the patient's prosthesis. 1/2 shaft width posterior and medial displacement and slight shortening noted at the fracture site. Electronically Signed   By: Garald Balding M.D.   On: 09/14/2016 20:08   Ct Head Wo Contrast  Result Date: 09/03/2016 CLINICAL DATA:  Status post unwitnessed fall, with concern for head injury. Patient on Coumadin. Initial encounter. EXAM: CT HEAD WITHOUT CONTRAST TECHNIQUE: Contiguous axial images were  obtained from the base of the skull through the vertex without intravenous contrast. COMPARISON:  MRI of the brain performed 01/19/2011, and CT of the head performed 01/14/2011 FINDINGS: Brain: No evidence of acute infarction, hemorrhage, hydrocephalus, extra-axial collection or mass lesion/mass effect. Prominence of the ventricles and sulci reflects moderate cortical volume loss. Diffuse periventricular and subcortical white matter change likely reflects small vessel ischemic microangiopathy. Mild cerebellar atrophy is noted. The brainstem and fourth ventricle are within normal limits. The basal ganglia are unremarkable in appearance. The cerebral hemispheres demonstrate grossly normal gray-white differentiation. No mass effect or midline shift is seen. Vascular: No hyperdense vessel or unexpected calcification. Skull: There is no evidence of fracture; visualized osseous structures are unremarkable in appearance. Sinuses/Orbits: The abdominal aorta is unremarkable in appearance. The inferior vena cava is grossly unremarkable. No retroperitoneal lymphadenopathy is seen. No pelvic sidewall lymphadenopathy is identified. Other: No significant soft tissue abnormalities are seen. IMPRESSION: 1. No evidence of traumatic intracranial injury or fracture. 2. Moderate cortical volume loss and diffuse small vessel ischemic microangiopathy. Electronically Signed   By: Garald Balding M.D.   On: 09/11/2016 20:11   Dg Hip Unilat W Or Wo Pelvis 2-3 Views Right  Result Date: 08/27/2016 CLINICAL DATA:  Nursing home patient with unwitnessed fall EXAM: DG HIP (WITH OR WITHOUT PELVIS) 2-3V RIGHT COMPARISON:  CT scan 01/12/2016 FINDINGS: No definite acute displaced fracture or dislocation. Mild degenerative changes of both hips. Pubic symphysis appears intact. Mild SI joint arthritic change. IMPRESSION: No radiographic evidence for acute osseous abnormality Electronically Signed   By: Donavan Foil M.D.   On: 09/13/2016 20:06     ROS Blood pressure 127/69, pulse 85, temperature 97.4 F (36.3 C), temperature source Oral, resp. rate 18, height '5\' 2"'$  (1.575 m),  weight 81.3 kg (179 lb 3.7 oz), SpO2 98 %. Physical Exam  Constitutional: She appears well-developed and well-nourished.  Eyes: Pupils are equal, round, and reactive to light.  Cardiovascular: Normal rate and regular rhythm.   Respiratory: Effort normal.  GI: Soft. Bowel sounds are normal.  Musculoskeletal:       Right knee: She exhibits decreased range of motion, swelling, effusion and bony tenderness. Tenderness found. Medial joint line and lateral joint line tenderness noted.       Left knee: She exhibits decreased range of motion, swelling, effusion, ecchymosis and bony tenderness. Tenderness found. Medial joint line and lateral joint line tenderness noted.  Neurological: She is alert.   She is alert, but not oriented.   Assessment/Plan: Bilateral periprosthetic femur fractures 1)  This is quite a complicated situation given the complexity of these fractures and her dementia.  She will need surgical fixation of both femurs, which will be quite an undertaking.  No surgery is planned for today.  Most likely I will put her on the schedule for Friday.  She will need medical clearance for surgery and I will need to contact the family for informed consent.  She can eat from my standpoint and I'm fine with Lovenox for DVT coverage.  Will follow.  Mcarthur Rossetti 08/30/2016, 7:05 AM

## 2016-08-31 LAB — CBC
HCT: 30.3 % — ABNORMAL LOW (ref 36.0–46.0)
Hemoglobin: 9.4 g/dL — ABNORMAL LOW (ref 12.0–15.0)
MCH: 28.1 pg (ref 26.0–34.0)
MCHC: 31 g/dL (ref 30.0–36.0)
MCV: 90.7 fL (ref 78.0–100.0)
PLATELETS: 247 10*3/uL (ref 150–400)
RBC: 3.34 MIL/uL — ABNORMAL LOW (ref 3.87–5.11)
RDW: 15.8 % — AB (ref 11.5–15.5)
WBC: 10.9 10*3/uL — AB (ref 4.0–10.5)

## 2016-08-31 LAB — GLUCOSE, CAPILLARY
GLUCOSE-CAPILLARY: 102 mg/dL — AB (ref 65–99)
GLUCOSE-CAPILLARY: 105 mg/dL — AB (ref 65–99)
GLUCOSE-CAPILLARY: 112 mg/dL — AB (ref 65–99)

## 2016-08-31 LAB — PROTIME-INR
INR: 2.28
Prothrombin Time: 25.5 seconds — ABNORMAL HIGH (ref 11.4–15.2)

## 2016-08-31 MED FILL — Medication: Qty: 1 | Status: AC

## 2016-09-01 SURGERY — OPEN REDUCTION INTERNAL FIXATION (ORIF) DISTAL FEMUR FRACTURE
Anesthesia: General | Laterality: Bilateral

## 2016-09-20 NOTE — Progress Notes (Signed)
CH rec'd page from colleague re: code blue call. When I arrived pt's son and daughter-in-law were in another room and very, appropriately, upset regarding the shock of the passing of their mother. Pt's son was processing by asking questions, asking what he could have done, and clearly grief-stricken. His wife was very attentive and supportive as was the staff. CH offered emotional support and assistance in retrieving pt's belongings (with staff) and handing them to the family. CH also accompanied family to pt room for final good-byes. [facility] of [facility]. Chaplain Marjory LiesPamela Carrington Holder, M.Div.   09/16/2016 1905  Clinical Encounter Type  Visited With Family

## 2016-09-20 NOTE — Progress Notes (Signed)
Patient was found unresponsive at 1705 by RN, CPR initiated, Code was called. With extensive efforts all teams: Respiratory, ED MD, Dr. Raymond Gurney. Vega, Dr. Seth BakeElgergaway, Anesthesiologist, Medications, CPR and Airway was attempted with no success. After 20 minutes Dr pronounced dead. Patient and family was called and made aware of the situation. Body was prepared and taken to morgue.

## 2016-09-20 NOTE — ED Provider Notes (Signed)
Cardiopulmonary Resuscitation (CPR) Procedure Note Directed/Performed by: Arby BarrettePfeiffer, Eshani Maestre I personally directed ancillary staff and/or performed CPR in an effort to regain return of spontaneous circulation and to maintain cardiac, neuro and systemic perfusion.   CODE BLUE called for orthopedic 6th  Floor.  On arrival, staff had a bag-valve-mask on the patient with evidence of significant emesis starting the patient. Chest compressions were being actively performed. Patient was on a pacer defibrillator. Nurse reported the patient had gotten 3 rounds of epinephrine.  Reportedly the patient had been seen at 4 PM and given pain medications. At that time, the nurse noted her to be uncomfortable but not other apparent problems. Recheck was done at 5:05 PM. Patient was found unresponsive, not moving with eyes open.   Upon my arrival to the room the patient had a documented approximate down time of 10 minutes. Rhythm showed asystole and femoral pulse is not present.  Chest compressions were continued, an additional 1 mg epinephrine administered, rhythm check and pulse check showed asystole with no palpable peripheral pulses.  Bag-valve-mask was continued as anesthesia attempted an airway. Difficult airway was encountered by anesthesia with bloody secretions and emesis in the airway. First pass attempt showed some color change but with auscultation by myself,  gurgling sounds in the epigastrium and minimal air movement in the lung fields consistent with esophageal intubation. I did not feel that repeat attempt was indicated as the patient was pulseless with asystole at the initiation of the resuscitation. Patient already had total down time of 20 minutes with no return of pulses and asystole throughout. At that time the code was terminated.  CRITICAL CARE Performed by: Arby BarrettePfeiffer, Shifra Swartzentruber   Total critical care time: 20 minutes  Critical care time was exclusive of separately billable procedures and treating  other patients.  Critical care was necessary to treat or prevent imminent or life-threatening deterioration.  Critical care was time spent personally by me on the following activities: development of treatment plan with patient and/or surrogate as well as nursing, discussions with consultants, evaluation of patient's response to treatment, examination of patient, obtaining history from patient or surrogate, ordering and performing treatments and interventions, ordering and review of laboratory studies, ordering and review of radiographic studies, pulse oximetry and re-evaluation of patient's condition.    Arby BarretteMarcy Kingdom Vanzanten, MD 08/28/2016 [phone]

## 2016-09-20 NOTE — Discharge Summary (Signed)
Death Summary  Bianca Wyatt ZOX:[id number] DOB: Oct 11, 1934 DOA: 09/16/2016  PCP: Bianca Cox, MD  Admit date: 2016/09/16 Date of Death: 18-Sep-2016 Time of Death: 04/15/29 Notification: Bianca Cox, MD notified of death of September 18, 2016   History of present illness:  Bianca Wyatt is a 82 y.o. female with a history of DVT Bianca Wyatt presented with complaint of pain after fall diagnosed with BL femur fx's Bianca Wyatt did not improve   Final Diagnoses:  1.   Respiratory failure: suspected fat emboli vs pe (less likely)   The results of significant diagnostics from this hospitalization (including imaging, microbiology, ancillary and laboratory) are listed below for reference.    Significant Diagnostic Studies: Dg Chest 2 View  Result Date: 16-Sep-2016 CLINICAL DATA:  Status post unwitnessed fall. Concern for chest injury. Initial encounter. EXAM: CHEST  2 VIEW COMPARISON:  Chest radiograph performed 01/14/2011 FINDINGS: The lungs are well-aerated and clear. There is no evidence of focal opacification, pleural effusion or pneumothorax. The heart is borderline enlarged. No acute osseous abnormalities are seen. Mild degenerative change is noted at the glenohumeral joints bilaterally. IMPRESSION: Borderline cardiomegaly. Lungs remain grossly clear. No displaced rib fracture seen. Electronically Signed   By: Roanna Raider M.D.   On: 09/16/16 20:06   Dg Knee 2 Views Left  Result Date: 2016/09/16 CLINICAL DATA:  Status post unwitnessed fall, with left knee pain. Initial encounter. EXAM: LEFT KNEE - 1-2 VIEW COMPARISON:  Left knee radiographs performed 03/12/2009 FINDINGS: There is a near horizontal fracture through the distal femoral metaphysis, extending to the femoral portion of the patient's prosthesis, with mild posterior displacement and rotation of the distal fragment. The tibial component appears grossly intact. There is no evidence of loosening. The patellar  component is not well assessed on radiograph. No significant joint effusion is seen. The visualized soft tissues are grossly unremarkable in appearance. IMPRESSION: Near horizontal fracture through the distal femoral metaphysis, extending to the femoral portion of the patient's prosthesis, with mild posterior displacement and rotation of the distal fragment. Electronically Signed   By: Roanna Raider M.D.   On: September 16, 2016 20:10   Dg Knee 2 Views Right  Result Date: 09-16-16 CLINICAL DATA:  Status post unwitnessed fall, with right knee pain. Initial encounter. EXAM: RIGHT KNEE - 1-2 VIEW COMPARISON:  None. FINDINGS: There is a displaced oblique fracture extending through the distal femoral metaphysis, extending to the femoral portion of the patient's prosthesis. Approximately 1/2 shaft width posterior and medial displacement and slight shortening is noted. No significant knee joint effusion is seen. The tibial component is unremarkable in appearance. The patellar component is not well assessed on radiograph. IMPRESSION: Displaced oblique fracture extending through the distal femoral metaphysis, extending to the femoral portion of the patient's prosthesis. 1/2 shaft width posterior and medial displacement and slight shortening noted at the fracture site. Electronically Signed   By: Roanna Raider M.D.   On: 2016/09/16 20:08   Ct Head Wo Contrast  Result Date: 2016/09/16 CLINICAL DATA:  Status post unwitnessed fall, with concern for head injury. Patient on Coumadin. Initial encounter. EXAM: CT HEAD WITHOUT CONTRAST TECHNIQUE: Contiguous axial images were obtained from the base of the skull through the vertex without intravenous contrast. COMPARISON:  MRI of the brain performed 01/19/2011, and CT of the head performed 01/14/2011 FINDINGS: Brain: No evidence of acute infarction, hemorrhage, hydrocephalus, extra-axial collection or mass lesion/mass effect. Prominence of the ventricles and sulci reflects  moderate cortical volume loss. Diffuse periventricular and subcortical white  matter change likely reflects small vessel ischemic microangiopathy. Mild cerebellar atrophy is noted. The brainstem and fourth ventricle are within normal limits. The basal ganglia are unremarkable in appearance. The cerebral hemispheres demonstrate grossly normal gray-white differentiation. No mass effect or midline shift is seen. Vascular: No hyperdense vessel or unexpected calcification. Skull: There is no evidence of fracture; visualized osseous structures are unremarkable in appearance. Sinuses/Orbits: The abdominal aorta is unremarkable in appearance. The inferior vena cava is grossly unremarkable. No retroperitoneal lymphadenopathy is seen. No pelvic sidewall lymphadenopathy is identified. Other: No significant soft tissue abnormalities are seen. IMPRESSION: 1. No evidence of traumatic intracranial injury or fracture. 2. Moderate cortical volume loss and diffuse small vessel ischemic microangiopathy. Electronically Signed   By: Roanna Raider M.D.   On: 09-11-2016 20:11   Ct Knee Left Wo Contrast  Result Date: 08/23/2016 CLINICAL DATA:  Fall with distal femur fractures. Bilateral total knee prostheses. EXAM: CT OF THE LEFT KNEE WITHOUT CONTRAST CT OF THE RIGHT KNEE WITHOUT CONTRAST TECHNIQUE: Multidetector CT imaging of the left knee was performed according to the standard protocol. Multiplanar CT image reconstructions were also generated. Multidetector CT imaging of the right knee was performed according to the standard protocol. Multiplanar CT image reconstructions were also generated. COMPARISON:  09-11-16 FINDINGS: Right knee: Extending along the anterior margin of the femoral component of the total knee prosthesis, there is an oblique fracture of the distal femoral metadiaphysis with 1.6 cm overlap and 2.0 cm anterior displacement of the distal fracture fragment with SPECT of the proximal. The distal extent of the  fracture extends along the lateral margin of the prosthesis and down towards the anterior margin of the lateral femoral epicondyle and condyles. As expected, there surrounding edema/mild hematoma. Underlying bony demineralization is present. There is fatty atrophy of the semimembranosus muscle distally. Ice pack noted situated between the knees. No definite proximal tibial or proximal fibular fracture. Tibial tray component of the prosthesis appears unremarkable. Left knee: Similar to the contralateral side, but with a more transverse orientation, there is a fracture of the distal femoral diaphysis tracking along the upper and lateral margins of the femoral component of the total knee prosthesis. There is only about 5 mm of overlap and only about 4 mm of anterior displacement of the shaft fragment with respect to the distal fragment. Generalized bony demineralization. No tibial fracture is identified. Cortical lesion of the proximal fibular diaphysis posteriorly on image 53/605, possibly an old fibrous cortical defect, this does resemble an osteochondroma but does not appear to share medullary space with the fibular shaft. No other fracture or complicating feature identified. Suspected knee effusion and localized mild hematoma in the vicinity of the fracture. IMPRESSION: 1. Bilateral distal femoral fractures tracking along the margins of the femoral components of the prosthesis especially laterally. The right-sided fracture is more oblique in morphology and is associated with more displacement. The Electronically Signed   By: Gaylyn Rong M.D.   On: 09/02/2016 09:15   Ct Knee Right Wo Contrast  Result Date: 08/29/2016 CLINICAL DATA:  Fall with distal femur fractures. Bilateral total knee prostheses. EXAM: CT OF THE LEFT KNEE WITHOUT CONTRAST CT OF THE RIGHT KNEE WITHOUT CONTRAST TECHNIQUE: Multidetector CT imaging of the left knee was performed according to the standard protocol. Multiplanar CT image  reconstructions were also generated. Multidetector CT imaging of the right knee was performed according to the standard protocol. Multiplanar CT image reconstructions were also generated. COMPARISON:  09-11-2016 FINDINGS: Right knee: Extending along  the anterior margin of the femoral component of the total knee prosthesis, there is an oblique fracture of the distal femoral metadiaphysis with 1.6 cm overlap and 2.0 cm anterior displacement of the distal fracture fragment with SPECT of the proximal. The distal extent of the fracture extends along the lateral margin of the prosthesis and down towards the anterior margin of the lateral femoral epicondyle and condyles. As expected, there surrounding edema/mild hematoma. Underlying bony demineralization is present. There is fatty atrophy of the semimembranosus muscle distally. Ice pack noted situated between the knees. No definite proximal tibial or proximal fibular fracture. Tibial tray component of the prosthesis appears unremarkable. Left knee: Similar to the contralateral side, but with a more transverse orientation, there is a fracture of the distal femoral diaphysis tracking along the upper and lateral margins of the femoral component of the total knee prosthesis. There is only about 5 mm of overlap and only about 4 mm of anterior displacement of the shaft fragment with respect to the distal fragment. Generalized bony demineralization. No tibial fracture is identified. Cortical lesion of the proximal fibular diaphysis posteriorly on image 53/605, possibly an old fibrous cortical defect, this does resemble an osteochondroma but does not appear to share medullary space with the fibular shaft. No other fracture or complicating feature identified. Suspected knee effusion and localized mild hematoma in the vicinity of the fracture. IMPRESSION: 1. Bilateral distal femoral fractures tracking along the margins of the femoral components of the prosthesis especially  laterally. The right-sided fracture is more oblique in morphology and is associated with more displacement. The Electronically Signed   By: Gaylyn Rong M.D.   On: 09/02/2016 09:15   Dg Hip Unilat W Or Wo Pelvis 2-3 Views Right  Result Date: 2016-09-24 CLINICAL DATA:  Nursing home patient with unwitnessed fall EXAM: DG HIP (WITH OR WITHOUT PELVIS) 2-3V RIGHT COMPARISON:  CT scan 01/12/2016 FINDINGS: No definite acute displaced fracture or dislocation. Mild degenerative changes of both hips. Pubic symphysis appears intact. Mild SI joint arthritic change. IMPRESSION: No radiographic evidence for acute osseous abnormality Electronically Signed   By: Jasmine Pang M.D.   On: 09/24/16 20:06    Microbiology: No results found for this or any previous visit (from the past 240 hour(s)).   Labs: Basic Metabolic Panel:  Recent Labs Lab 24-Sep-2016 1942 08/30/16 0438  NA 140 141  K 4.4 4.9  CL 107 108  CO2 23 22  GLUCOSE 147* 117*  BUN 40* 40*  CREATININE 2.95* 3.01*  CALCIUM 10.0 9.7   Liver Function Tests: No results for input(s): AST, ALT, ALKPHOS, BILITOT, PROT, ALBUMIN in the last 168 hours. No results for input(s): LIPASE, AMYLASE in the last 168 hours. No results for input(s): AMMONIA in the last 168 hours. CBC:  Recent Labs Lab September 24, 2016 1942 08/30/16 0438 09/04/2016 0433  WBC 12.9* 10.5 10.9*  NEUTROABS 10.4*  --   --   HGB 11.1* 10.3* 9.4*  HCT 35.0* 31.2* 30.3*  MCV 89.1 85.5 90.7  PLT 277 173 247   Cardiac Enzymes: No results for input(s): CKTOTAL, CKMB, CKMBINDEX, TROPONINI in the last 168 hours. D-Dimer No results for input(s): DDIMER in the last 72 hours. BNP: Invalid input(s): POCBNP CBG:  Recent Labs Lab 08/30/16 1745 08/30/16 2033 08/23/2016 0704 09/05/2016 1156 09/16/2016 1725  GLUCAP 99 91 102* 105* 112*   Anemia work up No results for input(s): VITAMINB12, FOLATE, FERRITIN, TIBC, IRON, RETICCTPCT in the last 72 hours. Urinalysis    Component  Value Date/Time   COLORURINE YELLOW 11/17/2010 1311   APPEARANCEUR CLOUDY (A) 11/17/2010 1311   LABSPEC 1.020 11/17/2010 1311   PHURINE 5.5 11/17/2010 1311   GLUCOSEU NEGATIVE 11/17/2010 1311   HGBUR NEGATIVE 11/17/2010 1311   BILIRUBINUR NEGATIVE 11/17/2010 1311   KETONESUR NEGATIVE 11/17/2010 1311   PROTEINUR NEGATIVE 11/17/2010 1311   UROBILINOGEN 0.2 11/17/2010 1311   NITRITE NEGATIVE 11/17/2010 1311   LEUKOCYTESUR  11/17/2010 1311    NEGATIVE MICROSCOPIC NOT DONE ON URINES WITH NEGATIVE PROTEIN, BLOOD, LEUKOCYTES, NITRITE, OR GLUCOSE <1000 mg/dL.   Sepsis Labs Invalid input(s): PROCALCITONIN,  WBC,  LACTICIDVEN   SIGNED:  Penny PiaVEGA, Manali Mcelmurry, MD  [facility] 12/13/15, 6:16 PM Pager   If 7PM-7AM, please contact night-coverage [email or web address] Password [email or web address]

## 2016-09-20 NOTE — Progress Notes (Signed)
Patient ID: Bianca Wyatt, female   DOB: 03/23/1934, 82 y.o.   MRN: [id number] I spoke to her son in length and described her fractures and the recommendation for surgery given the fracture displacement and her pain.  A discussion of the risks and benefits was made in detail.  He understands that we will proceed to surgery late tomorrow.

## 2016-09-20 DEATH — deceased
